# Patient Record
Sex: Male | Born: 1975 | Race: White | Hispanic: No | Marital: Single | Smoking: Current every day smoker
Health system: Southern US, Community
[De-identification: ages and names within clinical notes are randomized; demographics above are authoritative.]

## PROBLEM LIST (undated history)

## (undated) DIAGNOSIS — Z720 Tobacco use: Secondary | ICD-10-CM

## (undated) DIAGNOSIS — I456 Pre-excitation syndrome: Secondary | ICD-10-CM

## (undated) DIAGNOSIS — I471 Supraventricular tachycardia, unspecified: Secondary | ICD-10-CM

---

## 1999-10-14 ENCOUNTER — Encounter: Payer: Self-pay | Admitting: Emergency Medicine

## 1999-10-14 ENCOUNTER — Emergency Department (HOSPITAL_COMMUNITY): Admission: EM | Admit: 1999-10-14 | Discharge: 1999-10-14 | Payer: Self-pay | Admitting: Emergency Medicine

## 2002-07-25 ENCOUNTER — Encounter: Payer: Self-pay | Admitting: Emergency Medicine

## 2002-07-25 ENCOUNTER — Emergency Department (HOSPITAL_COMMUNITY): Admission: EM | Admit: 2002-07-25 | Discharge: 2002-07-25 | Payer: Self-pay | Admitting: Emergency Medicine

## 2003-05-09 ENCOUNTER — Emergency Department (HOSPITAL_COMMUNITY): Admission: EM | Admit: 2003-05-09 | Discharge: 2003-05-10 | Payer: Self-pay | Admitting: Emergency Medicine

## 2003-05-10 ENCOUNTER — Encounter: Payer: Self-pay | Admitting: Emergency Medicine

## 2011-10-04 ENCOUNTER — Other Ambulatory Visit: Payer: Self-pay

## 2011-10-04 ENCOUNTER — Encounter: Payer: Self-pay | Admitting: *Deleted

## 2011-10-04 ENCOUNTER — Encounter (HOSPITAL_COMMUNITY)
Admission: EM | Disposition: A | Discharge status: Home / Self Care | Payer: Self-pay | Source: Home / Self Care | Attending: Cardiology

## 2011-10-04 ENCOUNTER — Inpatient Hospital Stay (HOSPITAL_COMMUNITY)
Admission: EM | Admit: 2011-10-04 | Discharge: 2011-10-05 | DRG: 287 | Disposition: A | Discharge status: Home / Self Care | Payer: 59 | Attending: Cardiology | Admitting: Cardiology

## 2011-10-04 ENCOUNTER — Emergency Department (HOSPITAL_COMMUNITY): Payer: Self-pay

## 2011-10-04 DIAGNOSIS — R072 Precordial pain: Secondary | ICD-10-CM

## 2011-10-04 DIAGNOSIS — F17200 Nicotine dependence, unspecified, uncomplicated: Secondary | ICD-10-CM | POA: Diagnosis present

## 2011-10-04 DIAGNOSIS — I471 Supraventricular tachycardia, unspecified: Secondary | ICD-10-CM

## 2011-10-04 DIAGNOSIS — Z720 Tobacco use: Secondary | ICD-10-CM

## 2011-10-04 DIAGNOSIS — I2119 ST elevation (STEMI) myocardial infarction involving other coronary artery of inferior wall: Secondary | ICD-10-CM

## 2011-10-04 DIAGNOSIS — G609 Hereditary and idiopathic neuropathy, unspecified: Secondary | ICD-10-CM | POA: Diagnosis present

## 2011-10-04 DIAGNOSIS — I498 Other specified cardiac arrhythmias: Principal | ICD-10-CM | POA: Diagnosis present

## 2011-10-04 DIAGNOSIS — R0602 Shortness of breath: Secondary | ICD-10-CM | POA: Diagnosis present

## 2011-10-04 DIAGNOSIS — I456 Pre-excitation syndrome: Secondary | ICD-10-CM | POA: Insufficient documentation

## 2011-10-04 DIAGNOSIS — E876 Hypokalemia: Secondary | ICD-10-CM | POA: Diagnosis present

## 2011-10-04 HISTORY — DX: Pre-excitation syndrome: I45.6

## 2011-10-04 HISTORY — DX: Supraventricular tachycardia, unspecified: I47.10

## 2011-10-04 HISTORY — DX: Tobacco use: Z72.0

## 2011-10-04 HISTORY — PX: CARDIAC CATHETERIZATION: SHX172

## 2011-10-04 HISTORY — DX: Supraventricular tachycardia: I47.1

## 2011-10-04 HISTORY — PX: LEFT HEART CATHETERIZATION WITH CORONARY ANGIOGRAM: SHX5451

## 2011-10-04 LAB — CBC
MCH: 32.5 pg (ref 26.0–34.0)
MCV: 88.1 fL (ref 78.0–100.0)
MCV: 90.9 fL (ref 78.0–100.0)
Platelets: 270 10*3/uL (ref 150–400)
Platelets: 197 10*3/uL (ref 150–400)
RDW: 12 % (ref 11.5–15.5)
RDW: 12.2 % (ref 11.5–15.5)
WBC: 14.2 10*3/uL — ABNORMAL HIGH (ref 4.0–10.5)
WBC: 8.6 10*3/uL (ref 4.0–10.5)

## 2011-10-04 LAB — CARDIAC PANEL(CRET KIN+CKTOT+MB+TROPI)
CK, MB: 3.5 ng/mL (ref 0.3–4.0)
Relative Index: 1.5 (ref 0.0–2.5)
Total CK: 253 U/L — ABNORMAL HIGH (ref 7–232)
Troponin I: <0.3 ng/mL (ref <0.30)
Troponin I: <0.3 ng/mL (ref <0.30)

## 2011-10-04 LAB — RAPID URINE DRUG SCREEN, HOSP PERFORMED
Amphetamines: NOT DETECTED
Cocaine: NOT DETECTED
Opiates: NOT DETECTED

## 2011-10-04 LAB — COMPREHENSIVE METABOLIC PANEL
ALT: 11 U/L (ref 0–53)
AST: 18 U/L (ref 0–37)
CO2: 18 mEq/L — ABNORMAL LOW (ref 19–32)
Calcium: 9.7 mg/dL (ref 8.4–10.5)
Sodium: 139 mEq/L (ref 135–145)
Total Protein: 6.5 g/dL (ref 6.0–8.3)

## 2011-10-04 LAB — DIFFERENTIAL
Basophils Absolute: 0 10*3/uL (ref 0.0–0.1)
Eosinophils Absolute: 0.1 10*3/uL (ref 0.0–0.7)
Eosinophils Relative: 1 % (ref 0–5)
Lymphocytes Relative: 19 % (ref 12–46)

## 2011-10-04 LAB — CREATININE, SERUM
Creatinine, Ser: 0.92 mg/dL (ref 0.50–1.35)
GFR calc Af Amer: >90 mL/min (ref >90)

## 2011-10-04 LAB — POCT I-STAT, CHEM 8
BUN: 8 mg/dL (ref 6–23)
Calcium, Ion: 1.01 mmol/L — ABNORMAL LOW (ref 1.12–1.32)
Chloride: 105 mEq/L (ref 96–112)
Glucose, Bld: 101 mg/dL — ABNORMAL HIGH (ref 70–99)

## 2011-10-04 LAB — POCT I-STAT TROPONIN I

## 2011-10-04 SURGERY — LEFT HEART CATHETERIZATION WITH CORONARY ANGIOGRAM
Anesthesia: LOCAL

## 2011-10-04 MED ORDER — ONDANSETRON HCL 4 MG/2ML IJ SOLN: 4.0000 mg | Freq: Four times a day (QID) | INTRAMUSCULAR | Status: DC | PRN

## 2011-10-04 MED ORDER — NITROGLYCERIN 0.4 MG SL SUBL: 0.4000 mg | SUBLINGUAL_TABLET | SUBLINGUAL | Status: DC | PRN

## 2011-10-04 MED ORDER — HEPARIN (PORCINE) IN NACL 2-0.9 UNIT/ML-% IJ SOLN
INTRAMUSCULAR | Status: AC
  Filled 2011-10-04: qty 1000

## 2011-10-04 MED ORDER — SODIUM CHLORIDE 0.9 % IJ SOLN: 3.0000 mL | INTRAMUSCULAR | Status: DC | PRN

## 2011-10-04 MED ORDER — LORAZEPAM 2 MG/ML IJ SOLN
1.0000 mg | Freq: Once | INTRAMUSCULAR | Status: AC
  Administered 2011-10-04: 1 mg via INTRAVENOUS
  Filled 2011-10-04: qty 1

## 2011-10-04 MED ORDER — POTASSIUM CHLORIDE CRYS ER 20 MEQ PO TBCR
40.0000 meq | EXTENDED_RELEASE_TABLET | Freq: Once | ORAL | Status: AC
  Administered 2011-10-04: 40 meq via ORAL
  Filled 2011-10-04: qty 2

## 2011-10-04 MED ORDER — FENTANYL CITRATE 0.05 MG/ML IJ SOLN
INTRAMUSCULAR | Status: AC
  Filled 2011-10-04: qty 2

## 2011-10-04 MED ORDER — LIDOCAINE HCL (PF) 1 % IJ SOLN
INTRAMUSCULAR | Status: AC
  Filled 2011-10-04: qty 30

## 2011-10-04 MED ORDER — SODIUM CHLORIDE 0.9 % IV SOLN
INTRAVENOUS | Status: AC
  Administered 2011-10-04: 20:00:00 via INTRAVENOUS

## 2011-10-04 MED ORDER — ASPIRIN EC 81 MG PO TBEC
81.0000 mg | DELAYED_RELEASE_TABLET | Freq: Every day | ORAL | Status: DC
  Administered 2011-10-05: 81 mg via ORAL
  Filled 2011-10-04: qty 1

## 2011-10-04 MED ORDER — MIDAZOLAM HCL 2 MG/2ML IJ SOLN
INTRAMUSCULAR | Status: AC
  Filled 2011-10-04: qty 2

## 2011-10-04 MED ORDER — VERAPAMIL HCL 2.5 MG/ML IV SOLN
INTRAVENOUS | Status: AC
  Filled 2011-10-04: qty 2

## 2011-10-04 MED ORDER — METOPROLOL TARTRATE 12.5 MG HALF TABLET
12.5000 mg | ORAL_TABLET | Freq: Two times a day (BID) | ORAL | Status: DC
  Administered 2011-10-04 – 2011-10-05 (×2): 12.5 mg via ORAL
  Filled 2011-10-04 (×4): qty 1

## 2011-10-04 MED ORDER — ACETAMINOPHEN 325 MG PO TABS: 650.0000 mg | ORAL_TABLET | Freq: Four times a day (QID) | ORAL | Status: DC | PRN

## 2011-10-04 MED ORDER — NICOTINE 21 MG/24HR TD PT24
21.0000 mg | MEDICATED_PATCH | Freq: Every day | TRANSDERMAL | Status: DC
  Administered 2011-10-04: 21 mg via TRANSDERMAL
  Filled 2011-10-04 (×2): qty 1

## 2011-10-04 MED ORDER — ENOXAPARIN SODIUM 40 MG/0.4ML ~~LOC~~ SOLN
40.0000 mg | SUBCUTANEOUS | Status: DC
  Administered 2011-10-04: 40 mg via SUBCUTANEOUS
  Filled 2011-10-04 (×2): qty 0.4

## 2011-10-04 MED ORDER — SODIUM CHLORIDE 0.9 % IV SOLN: 250.0000 mL | INTRAVENOUS | Status: DC | PRN

## 2011-10-04 MED ORDER — SODIUM CHLORIDE 0.9 % IV SOLN
Freq: Once | INTRAVENOUS | Status: AC
  Administered 2011-10-04: 01:00:00 via INTRAVENOUS

## 2011-10-04 MED ORDER — SODIUM CHLORIDE 0.9 % IJ SOLN: 3.0000 mL | Freq: Two times a day (BID) | INTRAMUSCULAR | Status: DC

## 2011-10-04 MED ORDER — NITROGLYCERIN 0.2 MG/ML ON CALL CATH LAB
INTRAVENOUS | Status: AC
  Filled 2011-10-04: qty 1

## 2011-10-04 MED ORDER — POTASSIUM CHLORIDE CRYS ER 20 MEQ PO TBCR
40.0000 meq | EXTENDED_RELEASE_TABLET | Freq: Every day | ORAL | Status: DC
  Administered 2011-10-05: 40 meq via ORAL
  Filled 2011-10-04: qty 2

## 2011-10-04 MED ORDER — HEPARIN SODIUM (PORCINE) 1000 UNIT/ML IJ SOLN
INTRAMUSCULAR | Status: AC
  Filled 2011-10-04: qty 1

## 2011-10-04 MED ORDER — ACETAMINOPHEN 325 MG PO TABS
650.0000 mg | ORAL_TABLET | ORAL | Status: DC | PRN
  Administered 2011-10-04: 650 mg via ORAL
  Filled 2011-10-04: qty 2

## 2011-10-04 MED ORDER — SODIUM CHLORIDE 0.9 % IV SOLN: INTRAVENOUS | Status: DC

## 2011-10-04 NOTE — ED Notes (Signed)
Cardiology at bedside consulting with pt.

## 2011-10-04 NOTE — Progress Notes (Signed)
  Echocardiogram 2D Echocardiogram has been performed.  Juanita Laster Marche Hottenstein, RDCS 10/04/2011, 12:23 PM

## 2011-10-04 NOTE — ED Notes (Signed)
Cards at bedside

## 2011-10-04 NOTE — ED Notes (Signed)
Pt called EMS for R shoulder pain and hand numbness.  When EMS arrived, pt has rr 30, R hands were numb and curled inward, and in svt (220).  EMS did vagal maneuvers, bringing hr down to 128.  SL to LAC 18G.

## 2011-10-04 NOTE — ED Notes (Signed)
Report received, assumed care.  

## 2011-10-04 NOTE — ED Notes (Signed)
portible echo at bedside

## 2011-10-04 NOTE — Procedures (Signed)
Cardiac Catheterization Procedure Note  Name: Albert Robles MRN: [id number] DOB: 1976/10/27  Procedure: Left Heart Cath, Selective Coronary Angiography  Indication: Inferior ST elevation, chest pain.    Procedural Details: The right wrist was prepped, draped, and anesthetized with 1% lidocaine. Using the modified Seldinger technique, a 5 French sheath was introduced into the right radial artery. 3 mg of verapamil was administered through the sheath, weight-based unfractionated heparin was administered intravenously. Standard Judkins catheters were used for selective coronary angiography. Catheter exchanges were performed over an exchange length guidewire. There were no immediate procedural complications. A TR band was used for radial hemostasis at the completion of the procedure.  The patient was transferred to the post catheterization recovery area for further monitoring.  Procedural Findings:  Coronary angiography: Coronary dominance: right  Left mainstem: No angiographic disease.  Left anterior descending (LAD): No angiographic disease.  Left circumflex (LCx): No angiographic disease.  Right coronary artery (RCA): Dominant vessel, no angiographic disease.  Final Conclusions:  No angiographic coronary disease.   Recommendations: Discuss with EP regarding WPW-related SVT.   Marca Ancona 10/04/2011, 3:21 PM

## 2011-10-04 NOTE — ED Provider Notes (Signed)
History     CSN: [id number] Arrival date & time: 10/04/2011 12:29 AM   First MD Initiated Contact with Patient 10/04/11 0032      Chief Complaint  Patient presents with  . Numbness    to R hand  . Tachycardia    (Consider location/radiation/quality/duration/timing/severity/associated sxs/prior treatment) HPI Comments: 35 year old male with no significant past medical problems who presents by ambulance after acute onset of right shoulder pain, right neck pain and mid chest pain. 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 He denies any other changes in his routine including substance abuse, alcohol use, stimulant use, over-the-counter medications, fevers chills nausea vomiting back pain belly pain swelling rashes weakness or change in vision.  He does admit to having tingling and numbness of his fingers bilaterally and the EMS noted that his fingers were "drawn up" on their arrival. He states he has never had this sensation before. EMS rhythm strips show SVT at pulse around 200. Post conversion strips show sinus tachycardia.  The history is provided by the patient and the EMS personnel.    History reviewed. No pertinent past medical history.  History reviewed. No pertinent past surgical history.  History reviewed. No pertinent family history.  History  Substance Use Topics  . Smoking status: Not on file  . Smokeless tobacco: Not on file  . Alcohol Use: Not on file      Review of Systems  All other systems reviewed and are negative.    Allergies  Review of patient's allergies indicates no known allergies.  Home Medications   Current Outpatient Rx  Name Route Sig Dispense Refill  . ACETAMINOPHEN 500 MG PO TABS Oral Take 500-1,000 mg by mouth every 6 (six) hours as needed. For pain       BP 104/57  Pulse 83  Temp(Src) 98.8 F (37.1 C) (Oral)   Resp 18  SpO2 100%  Physical Exam  Nursing note and vitals reviewed. Constitutional: He appears well-developed and well-nourished.       Anxious and tachypneic  HENT:  Head: Normocephalic and atraumatic.  Mouth/Throat: Oropharynx is clear and moist. No oropharyngeal exudate.  Eyes: Conjunctivae and EOM are normal. Pupils are equal, round, and reactive to light. Right eye exhibits no discharge. Left eye exhibits no discharge. No scleral icterus.  Neck: Normal range of motion. Neck supple. No JVD present. No thyromegaly present.  Cardiovascular: Regular rhythm, normal heart sounds and intact distal pulses.  Exam reveals no gallop and no friction rub.   No murmur heard.      Tachycardia  Pulmonary/Chest: Effort normal and breath sounds normal. No respiratory distress. He has no wheezes. He has no rales.  Abdominal: Soft. Bowel sounds are normal. He exhibits no distension and no mass. There is no tenderness.  Musculoskeletal: Normal range of motion. He exhibits no edema and no tenderness.  Lymphadenopathy:    He has no cervical adenopathy.  Neurological: He is alert. Coordination normal.  Skin: Skin is warm and dry. No rash noted. No erythema.  Psychiatric:       Anxious appearing    ED Course  Procedures (including critical care time)  Labs Reviewed  CBC - Abnormal; Notable for the following:    WBC 14.2 (*)    MCHC 37.1 (*) RULED OUT INTERFERING SUBSTANCES   All other components within normal limits  DIFFERENTIAL - Abnormal; Notable  for the following:    Neutro Abs 10.4 (*)    All other components within normal limits  COMPREHENSIVE METABOLIC PANEL - Abnormal; Notable for the following:    Potassium 3.1 (*)    CO2 18 (*)    Glucose, Bld 104 (*)    GFR calc non Af Amer 87 (*)    All other components within normal limits  POCT I-STAT, CHEM 8 - Abnormal; Notable for the following:    Potassium 3.0 (*)    Glucose, Bld 101 (*)    Calcium, Ion 1.01 (*)    All other components  within normal limits  CARDIAC PANEL(CRET KIN+CKTOT+MB+TROPI) - Abnormal; Notable for the following:    Total CK 253 (*)    All other components within normal limits  POCT I-STAT TROPONIN I  POCT I-STAT TROPONIN I  I-STAT, CHEM 8  I-STAT TROPONIN I  I-STAT TROPONIN I   Dg Chest Port 1 View  10/04/2011  *RADIOLOGY REPORT*  Clinical Data: 35 year old with shortness of breath.  PORTABLE CHEST - 1 VIEW  Comparison: None.  Findings: 0057 hours.  Heart size and mediastinal contours are normal.  The lungs appear clear.  Vague density projecting inferior to the left second rib is attributed to adjacent EKG lead.  There is no pleural effusion.  Osseous structures appear normal.  IMPRESSION: No acute cardiopulmonary process.  Original Report Authenticated By: Gerrianne Scale, M.D.     1. Wolf-Parkinson-White syndrome   2. SVT (supraventricular tachycardia)       MDM  Anxious-appearing young man with tachycardia, hyperventilation, normal oxygen saturation and blood pressure and temperature. ST-T according to the rhythm strips produced by EMS shows successful conversion of SVT into sinus tachycardia, we'll draw labs, chest x-ray, oxygen, reevaluate.   ED ECG REPORT   Date: 10/04/2011   Rate: 122  Rhythm: sinus tachycardia  QRS Axis: normal  Intervals: normal  ST/T Wave abnormalities: normal  Conduction Disutrbances: Prolonged QRS with positive delta wave consistent with Wolff-Parkinson-White syndrome  Narrative Interpretation: Abnormal EKG, WPW  Old EKG Reviewed: none available  ED ECG REPORT   Date: 10/04/2011   Rate: 100  Rhythm: normal sinus rhythm  QRS Axis: normal  Intervals: QRS prolonged with delta wave  ST/T Wave abnormalities: nonspecific ST/T changes  Conduction Disutrbances:nonspecific intraventricular conduction delay  Narrative Interpretation: Slight abnormalities of the ST segments of the inferior leads and T waves of the anterior precordial leads V1 V2. Incomplete  right bundle branch block likely  Old EKG Reviewed: changes noted  Care discussed with cardiologist on call Dr. Antoine Poche who agrees with emergency department rule out with cardiac enzymes, electrophysiology followup. Patient reevaluated and currently has no chest pain or shortness of breath and states that he has never had any palpitations in the past  Discussed care with cardiology on-call after patient has had repeated EKGs showing dynamic changes including T wave inversion in precordial leads. Cardiology will see the patient in the emergency department this morning.  ED ECG REPORT   Date: 10/04/2011   Rate: 92  Rhythm: normal sinus rhythm  QRS Axis: normal  Intervals: normal  ST/T Wave abnormalities: T-wave inversions V1 V2 V3  Conduction Disutrbances:QRS widening and morphology consistent with incomplete right bundle branch block development  Narrative Interpretation: Delta wave persists consistent with WPW  Old EKG Reviewed: changes noted   Vida Roller, MD 10/04/11 [phone]

## 2011-10-04 NOTE — ED Notes (Signed)
MD at bedside. Dr.Miller talking to patient about diagnosis

## 2011-10-04 NOTE — ED Notes (Signed)
Albert Robles, ANP with [facility] at bedside.

## 2011-10-04 NOTE — ED Notes (Signed)
Patient denies pain and is resting comfortably. Denies CP, SOB or shoulder pain presently. VSS. NAD

## 2011-10-04 NOTE — ED Notes (Signed)
This note also relates to the following rows which could not be included: Pulse Rate - Cannot attach notes to rows marked as read only Resp - Cannot attach notes to rows marked as read only SpO2 - Cannot attach notes to rows marked as read only 

## 2011-10-04 NOTE — ED Notes (Signed)
Report received from Monica, RN.

## 2011-10-04 NOTE — H&P (Addendum)
Patient ID: Nhan Qualley MRN: [id number], DOB/AGE: 03/16/1976   Admit date: 10/04/2011   Primary Physician: No primary provider on file. Primary Cardiologist: New to [facility]  Pt. Profile:   35 y/o male w/o prior cardiac history who presented to the ED last night after an episode of c/p and tachypalps - found to have WPW.  Problem List: Past Medical History  Diagnosis Date  . Wolf-Parkinson-White syndrome     Diagnosed 10/04/2011  . Tobacco abuse     20 pack yr history - currently smoking 1ppd    History reviewed. No pertinent past surgical history.   Allergies: No Known Allergies  HPI:   35 y/o male w/o prior cardiac history.  Over the past month he has noted occasional r shoulder/subscapular discomfort that would come on and relieve spontaneously after a few minutes.  It has not limited his usual activities.  Last pm, around 10:30p, he was driving home from work and had sudden onset of r shoulder/subscapular pain radiating up to his right jaw/neck, assoc w/ sscp/tightness, dyspnea, and tachypalpitations.  Once he was home, he took a shower, which worsened Ss, then called EMS.  Upon EMS arrival, he was noted to be in SVT.  With the use of vagal maneuvers, pt apparently converted to sinus rhythm en route to ED.  Upon arrival here, he cont to c/o discomfort as outlined above.  Initial ECG showed sinus tach and it was noted that pt had distinct delta waves consistent w/ WPW.  ECG was reviewed by Cardiology overnight w/ recommendation for cardiac markers and prob d/c from ED with outpt cards/EP f/u.  Enzymes have been negative, though pt's chest and shoulder pain persisted until about 3-4am.  Pt is currently symptom free however his ECG now shows slight inflat ST elevation and deep anterior TWI.   Home Medications Medications Prior to Admission  Medication Dose Route Frequency Provider Last Rate Last Dose  . 0.9 %  sodium chloride infusion   Intravenous Once Vida Roller, MD  75 mL/hr at 10/04/11 0103    . LORazepam (ATIVAN) injection 1 mg  1 mg Intravenous Once Vida Roller, MD   1 mg at 10/04/11 0106   No current outpatient prescriptions on file as of 10/04/2011.     History reviewed. No pertinent family history.   History   Social History  . Marital Status: Single    Spouse Name: N/A    Number of Children: N/A  . 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 Smoking status: Current Everyday Smoker -- 1.0 packs/day for 20 years    Types: Cigarettes  . Smokeless tobacco: Never Used  . Alcohol Use: No  . Drug Use: No  . Sexually Active: Yes   Other Topics Concern  . Not on file   Social History Narrative   Lives with long-term girlfriend and young child     Review of Systems: General: negative for chills, fever, night sweats or weight changes.  Cardiovascular:+++ c/p, r shoulder pain, r scapular pain,  sob, palpitations as outlined in the HPI.  Dermatological: negative for rash Respiratory: negative for cough or wheezing Urologic: negative for hematuria Abdominal: negative for nausea, vomiting, diarrhea, bright red blood per rectum, melena, or hematemesis Neurologic: negative for visual changes, syncope, or dizziness All other systems reviewed and are otherwise negative except as noted above.  Physical Exam: Blood pressure 103/59, pulse 88,  temperature 98.8 F (37.1 C), temperature source Oral, resp. rate 19, SpO2 100.00%.  General: Well developed, well nourished, in no acute distress. Head: Normocephalic, atraumatic, sclera non-icteric, no xanthomas, nares are without discharge.  Neck: Supple without bruits or JVD. Lungs:  Resp regular and unlabored, scatt rhonchi. Heart: RRR no s3, s4, or murmurs. Abdomen: Soft, non-tender, non-distended, BS + x 4.  Msk:  Strength and tone appears normal for age. Extremities: No clubbing, cyanosis or edema. DP/PT/Radials 2+  and equal bilaterally. Neuro: Alert and oriented X 3. Moves all extremities spontaneously. Psych: Normal affect.   Labs:   Results for orders placed during the hospital encounter of 10/04/11 (from the past 72 hour(s))  CBC     Status: Abnormal   Collection Time   10/04/11 12:40 AM      Component Value Range Comment   WBC 14.2 (*) 4.0 - 10.5 (K/uL)    RBC 4.78  4.22 - 5.81 (MIL/uL)    Hemoglobin 15.6  13.0 - 17.0 (g/dL)    HCT 16.1  09.6 - 04.5 (%)    MCV 88.1  78.0 - 100.0 (fL)    MCH 32.6  26.0 - 34.0 (pg)    MCHC 37.1 (*) 30.0 - 36.0 (g/dL) RULED OUT INTERFERING SUBSTANCES   RDW 12.0  11.5 - 15.5 (%)    Platelets 270  150 - 400 (K/uL)   DIFFERENTIAL     Status: Abnormal   Collection Time   10/04/11 12:40 AM      Component Value Range Comment   Neutrophils Relative 73  43 - 77 (%)    Neutro Abs 10.4 (*) 1.7 - 7.7 (K/uL)    Lymphocytes Relative 19  12 - 46 (%)    Lymphs Abs 2.7  0.7 - 4.0 (K/uL)    Monocytes Relative 6  3 - 12 (%)    Monocytes Absolute 0.9  0.1 - 1.0 (K/uL)    Eosinophils Relative 1  0 - 5 (%)    Eosinophils Absolute 0.1  0.0 - 0.7 (K/uL)    Basophils Relative 0  0 - 1 (%)    Basophils Absolute 0.0  0.0 - 0.1 (K/uL)   COMPREHENSIVE METABOLIC PANEL     Status: Abnormal   Collection Time   10/04/11 12:40 AM      Component Value Range Comment   Sodium 139  135 - 145 (mEq/L)    Potassium 3.1 (*) 3.5 - 5.1 (mEq/L)    Chloride 104  96 - 112 (mEq/L)    CO2 18 (*) 19 - 32 (mEq/L)    Glucose, Bld 104 (*) 70 - 99 (mg/dL)    BUN 10  6 - 23 (mg/dL)    Creatinine, Ser 4.09  0.50 - 1.35 (mg/dL)    Calcium 9.7  8.4 - 10.5 (mg/dL)    Total Protein 6.5  6.0 - 8.3 (g/dL)    Albumin 4.1  3.5 - 5.2 (g/dL)    AST 18  0 - 37 (U/L)    ALT 11  0 - 53 (U/L)    Alkaline Phosphatase 44  39 - 117 (U/L)    Total Bilirubin 0.5  0.3 - 1.2 (mg/dL)    GFR calc non Af Amer 87 (*) >90 (mL/min)    GFR calc Af Amer >90  >90 (mL/min)   POCT I-STAT, CHEM 8     Status: Abnormal    Collection Time   10/04/11 12:59 AM      Component Value Range Comment  Sodium 142  135 - 145 (mEq/L)    Potassium 3.0 (*) 3.5 - 5.1 (mEq/L)    Chloride 105  96 - 112 (mEq/L)    BUN 8  6 - 23 (mg/dL)    Creatinine, Ser 2.13  0.50 - 1.35 (mg/dL)    Glucose, Bld 086 (*) 70 - 99 (mg/dL)    Calcium, Ion 5.78 (*) 1.12 - 1.32 (mmol/L)    TCO2 18  0 - 100 (mmol/L)    Hemoglobin 14.6  13.0 - 17.0 (g/dL)    HCT 46.9  62.9 - 52.8 (%)   POCT I-STAT TROPONIN I     Status: Normal   Collection Time   10/04/11  2:01 AM      Component Value Range Comment   Troponin i, poc 0.00  0.00 - 0.08 (ng/mL)    Comment 3            POCT I-STAT TROPONIN I     Status: Normal   Collection Time   10/04/11  5:32 AM      Component Value Range Comment   Troponin i, poc 0.02  0.00 - 0.08 (ng/mL)    Comment 3            CARDIAC PANEL(CRET KIN+CKTOT+MB+TROPI)     Status: Abnormal   Collection Time   10/04/11  6:10 AM      Component Value Range Comment   Total CK 253 (*) 7 - 232 (U/L)    CK, MB 3.7  0.3 - 4.0 (ng/mL)    Troponin I <0.30  <0.30 (ng/mL)    Relative Index 1.5  0.0 - 2.5       Radiology/Studies: Dg Chest Port 1 View  10/04/2011  *RADIOLOGY REPORT*  Clinical Data: 35 year old with shortness of breath.  PORTABLE CHEST - 1 VIEW  Comparison: None.  Findings: 0057 hours.  Heart size and mediastinal contours are normal.  The lungs appear clear.  Vague density projecting inferior to the left second rib is attributed to adjacent EKG lead.  There is no pleural effusion.  Osseous structures appear normal.  IMPRESSION: No acute cardiopulmonary process.  Original Report Authenticated By: Gerrianne Scale, M.D.    EKG:  RSR, 92, delta waves.  <40mm ST elevation - II, III, aVF, V5, V5; TWI V1-V3  ASSESSMENT AND PLAN:   1.  SVT/WPW:  Pt with an episode of SVT lasting approx by pt report - broke with vagal maneuvers by EMS.  ECG here shows delta waves consistent w/ WPW.  His EKG this am shows TWI in anterior  leads with inferlat st elevation.  Chest pain has resolved.  We will plan to observe him today.  Add low dose BB (BP is soft) and check echo.  We'll ask EP to see for consideration of eventual RFCA.  2.  Chest Pain/Abnl EKG:  See above. Enzymes have been negative.  Cont to cycle.  Add asa, bb.  3.  Tobacco Abuse:  Cessation advised.  Will order formal counseling.  4.  Hypokalemia:  Supp and check Mg.   Signed, [name], NP 10/04/2011, 8:45 AM   Patient seen with NP, agree with note.  Patient does not remember ever having an ECG in the past (even though he was in the [employer] for 4 years).  Regular SVT broke while on ambulance and has not recurred, telemetry strips from EMS reviewed.  He has had no prior episodes of tachypalpitations similar to last night.  He had prolonged  chest pain for about 2 hours even after resolution of SVT.  He has findings on ECG suggestive of WPW (delta wave).  Interestingly, he has also labile anterior T waves with development of anterior T wave inversion over the morning.  He has about 1 mm inferior ST elevation.  Cardiac enzymes so far negative.  Suspect his symptoms are all due to SVT related to WPW but will keep patient overnight for observation.  1. Chest pain, abnormal ECG: All changes cannot be explained by WPW.  ? Myopericarditis-type picture.  Doubt coronary disease but ECG is certainly worrisome.  No family history of premature CAD. - Serial ECGs - Echocardiogram: ? Myopericarditis.  - cycle cardiac enzymes - Start low dose beta blocker. - Would consider coronary CT angiogram tomorrow given very abnormal ECG but will reassess in the morning as long as enzymes remain negative.  2. SVT related to WPW.  Starting beta blocker.  - Will need EP evaluation for possible WPW ablation (will discuss with Dr. Graciela Husbands).   Marca Ancona 10/04/2011 9:31 AM  I discussed ECGs with Dr. Graciela Husbands.  We are both concerned about the inferior ST elevation, which does seem  to have worsened.  The labile anterior T waves also are not explained by post-WPW SVT.  Cardiac enzymes so far negative, chest pain resolved after about 2 hours last night.  Myopericarditis is certainly possible, will get echo.  However, we are concerned that there could be coronary thrombus or spasm given ECG findings.  We think that the best course will be definitive test, which would be catheterization today, rather than CT angiogram.    Marca Ancona 10/04/2011 11:36 AM

## 2011-10-05 ENCOUNTER — Other Ambulatory Visit: Payer: Self-pay

## 2011-10-05 ENCOUNTER — Encounter (HOSPITAL_COMMUNITY): Payer: Self-pay | Admitting: Internal Medicine

## 2011-10-05 DIAGNOSIS — I471 Supraventricular tachycardia: Secondary | ICD-10-CM

## 2011-10-05 LAB — LIPID PANEL
Total CHOL/HDL Ratio: 3.9 RATIO
VLDL: 24 mg/dL (ref 0–40)

## 2011-10-05 LAB — BASIC METABOLIC PANEL
BUN: 12 mg/dL (ref 6–23)
Creatinine, Ser: 1.08 mg/dL (ref 0.50–1.35)
GFR calc non Af Amer: 88 mL/min — ABNORMAL LOW (ref >90)
Glucose, Bld: 89 mg/dL (ref 70–99)
Potassium: 3.9 mEq/L (ref 3.5–5.1)

## 2011-10-05 LAB — TSH: TSH: 1.687 u[IU]/mL (ref 0.350–4.500)

## 2011-10-05 LAB — CBC
Hemoglobin: 12.9 g/dL — ABNORMAL LOW (ref 13.0–17.0)
MCH: 32.5 pg (ref 26.0–34.0)
MCHC: 35.2 g/dL (ref 30.0–36.0)
RDW: 12.2 % (ref 11.5–15.5)

## 2011-10-05 LAB — MAGNESIUM: Magnesium: 2 mg/dL (ref 1.5–2.5)

## 2011-10-05 MED ORDER — METOPROLOL TARTRATE 50 MG PO TABS: 50.0000 mg | ORAL_TABLET | ORAL | Status: DC | PRN

## 2011-10-05 NOTE — Consult Note (Signed)
Reason for Consult: symptomatic SVT with WPW syndrome   Referring Physician: Shirlee Latch  HPI: Mr. Garriga is a very pleasant 35 year old man who has never had symptoms of chest pain or palpitations. He is a former Arts development officer. The patient was in his usual state of health until the day of admission when he noted shoulder pain associated with shortness of breath. There is mild right-sided chest discomfort. He had no palpitations. He denies syncope or history of syncope. The patient presented to the emergency room where he was found to have SVT at 200 beats per minute. He performed vagal maneuvers and his symptoms resolved. He was admitted to the hospital with persistent ST elevation and underwent catheterization which demonstrated normal coronary arteries. He is referred now for additional evaluation. He denies any symptoms of SVT in the past. He denies any history of right-sided shoulder pain in the past. It is believed that these are his only symptoms associated with SVT besides shortness of breath.  PMH: Past Medical History  Diagnosis Date  . Wolf-Parkinson-White syndrome     Diagnosed 10/04/2011  . Tobacco abuse     20 pack yr history - currently smoking 1ppd  . Chest pain     in setting of SVT  . Angina 10/03/11    "first time ever"  . Shortness of breath 10/03/11    "related to SVT"  . Peripheral neuropathy 10/03/11    "related to SVT"    PSHX: Past Surgical History  Procedure Date  . Cardiac catheterization 10/04/11    FAMHX:History reviewed. No pertinent family history.  Social History:  reports that he has been smoking Cigarettes.  He has a 20 pack-year smoking history. He has never used smokeless tobacco. He reports that he does not drink alcohol or use illicit drugs.  Allergies: No Known Allergies  Dg Chest Port 1 View  10/04/2011  *RADIOLOGY REPORT*  Clinical Data: 35 year old with shortness of breath.  PORTABLE CHEST - 1 VIEW  Comparison: None.  Findings: 0057 hours.  Heart size  and mediastinal contours are normal.  The lungs appear clear.  Vague density projecting inferior to the left second rib is attributed to adjacent EKG lead.  There is no pleural effusion.  Osseous structures appear normal.  IMPRESSION: No acute cardiopulmonary process.  Original Report Authenticated By: Gerrianne Scale, M.D.    ROS  As stated in the HPI and negative for all other systems.  Physical Exam  Vitals:Blood pressure 126/74, pulse 72, temperature 98.6 F (37 C), temperature source Oral, resp. rate 20, SpO2 100.00%.  Well appearing young man, NAD HEENT: Unremarkable Neck:  No JVD, no thyromegally Lymphatics:  No adenopathy Back:  No CVA tenderness Lungs:  Clear with no wheezes, rales, or rhonchi. HEART:  Regular rate rhythm, no murmurs, no rubs, no clicks Abd:  Flat, positive bowel sounds, no organomegally, no rebound, no guarding Ext:  2 plus pulses, no edema, no cyanosis, no clubbing Skin:  No rashes no nodules.  Neuro:  CN II through XII intact, motor grossly intact  ECG - normal sinus rhythm with ventricular preexcitation consistent with a left lateral accessory pathway.  Assessment/Plan: 1. SVT - this is the initial presentation of the patient's SVT. I discussed the physiology of his SVT and the treatment options. He has been on no medical therapy at this point and because of this being his only episode, I have recommended that he take as needed beta blockers should his symptoms return. Catheter ablation was also discussed as  a potential curative treatment. However he has not undergone a trial of medical therapy. We'll plan to let the patient go home today with as needed beta blockers. I plan to see the patient back in the office in 4-6 weeks to see how he is doing. If he has a recurrence then consideration of catheter ablation would be recommended. Sharlot Gowda TaylorMD 10/05/2011, 8:02 AM

## 2011-10-05 NOTE — Discharge Summary (Signed)
Patient ID: Albert Robles,  MRN: [id number], DOB/AGE: [birth year] 35 y.o.  Admit date: 10/04/2011 Discharge date: 10/05/2011  Primary Care Provider: None Primary Cardiologist: Lewayne Bunting  Discharge Diagnoses Principal Problem:  *SVT (supraventricular tachycardia) Active Problems:  Chest pain  Wolf-Parkinson-White syndrome  Hypokalemia  Tobacco abuse   Allergies No Known Allergies  Procedures  10/04/2011 2D Echo Left ventricle: The cavity size was normal. Wall thickness was normal. Systolic function was normal. The estimated ejection fraction was in the range of 60% to 65%.  10/04/2011 Cardiac Catheterization Left mainstem: No angiographic disease.  Left anterior descending (LAD): No angiographic disease.  Left circumflex (LCx): No angiographic disease.  Right coronary artery (RCA): Dominant vessel, no angiographic disease.  Final Conclusions: No angiographic coronary disease.   History of Present Illness  35 y/o male w/o prior cardiac history. Over the past month he has noted occasional r shoulder/subscapular discomfort that would come on and relieve spontaneously after a few minutes. It has not limited his usual activities.  On the evening prior to admission, around 10:30p, he was driving home from work and had sudden onset of r shoulder/subscapular pain radiating up to his right jaw/neck, assoc w/ sscp/tightness, dyspnea, and tachypalpitations. Once he was home, he took a shower, which worsened Ss, then called EMS. Upon EMS arrival, he was noted to be in SVT. With the use of vagal maneuvers, pt apparently converted to sinus rhythm en route to ED. Upon arrival to the ED, he cont to c/o discomfort as outlined above. Initial ECG showed sinus tach and it was noted that pt had distinct delta waves consistent w/ WPW.  Follow-up EKG's showed progressive inferolateral st elevation with new anterior t-wave inversion.  Cardiac enzymes were negative and patient was symptom free.  Cardiology  was called to admit.  Hospital Course   Patient had no further SVT or chest pain  A 2D echo was performed and showed normal LV function.  Because of pts ST elevation and new anterior T wave inversion, none of which could be explained by the pts WPW/SVT, we opted to perform diagnostic cardiac catheterization.  This was carried out on 12/6 showing normal coronary arteries.  He has been seen by Electrophysiology with a recommendation to discharge pt today on prn beta blocker therapy with early f/u and consideration of radiofrequency catheter ablation in the future.   Discharge Vitals:  Blood pressure 126/74, pulse 72, temperature 98.6 F (37 C), temperature source Oral, resp. rate 20, SpO2 100.00%.    Labs: CBC:  Basename 10/05/11 0520 10/04/11 1811 10/04/11 0040  WBC 6.4 8.6 --  NEUTROABS -- -- 10.4*  HGB 12.9* 13.6 --  HCT 36.6* 38.1* --  MCV 92.2 90.9 --  PLT 173 197 --   Basic Metabolic Panel:  Basename 10/05/11 0520 10/04/11 2338 10/04/11 1811 10/04/11 0059 10/04/11 0040  NA 140 -- -- 142 --  K 3.9 -- -- 3.0* --  CL 108 -- -- 105 --  CO2 25 -- -- -- 18*  GLUCOSE 89 -- -- 101* --  BUN 12 -- -- 8 --  CREATININE 1.08 -- 0.92 -- --  CALCIUM 9.2 -- -- -- 9.7  MG -- 2.0 -- -- --  PHOS -- -- -- -- --   Liver Function Tests:  Basename 10/04/11 0040  AST 18  ALT 11  ALKPHOS 44  BILITOT 0.5  PROT 6.5  ALBUMIN 4.1   Cardiac Enzymes:  Basename 10/04/11 2338 10/04/11 1804 10/04/11 0610  CKTOTAL 351* 354* 253*  CKMB 3.4 3.5 3.7  CKMBINDEX -- -- --  TROPONINI <0.30 <0.30 <0.30   Fasting Lipid Panel:  Basename 10/05/11 0520  CHOL 133  HDL 34*  LDLCALC 75  TRIG 161  CHOLHDL 3.9  LDLDIRECT --   Thyroid Function Tests:  Basename 10/04/11 2338  TSH 1.687  T4TOTAL --  T3FREE --  THYROIDAB --    Disposition:   D/C home today in good condition.   Discharge Medications: Current Discharge Medication List    START taking these medications   Details    metoprolol tartrate (LOPRESSOR) 50 MG tablet Take 1 tablet (50 mg total) by mouth as needed (For recurrent palpitations). Qty: 30 tablet, Refills: 6      CONTINUE these medications which have NOT CHANGED   Details  acetaminophen (TYLENOL) 500 MG tablet Take 500-1,000 mg by mouth every 6 (six) hours as needed. For pain         Outstanding Labs/Studies  None  Duration of Discharge Encounter: Greater than 30 minutes including physician time.  Signed, [name] NP 10/05/2011, 10:15 AM

## 2011-10-12 ENCOUNTER — Telehealth: Payer: Self-pay | Admitting: Internal Medicine

## 2011-10-12 NOTE — Telephone Encounter (Signed)
Spoke with patient and he has had 2 episodes since being discharged from the hospital  He says he gets extreme dizziness  I have instructed him to take 1/2 the dose of Metoprolol if he has another episode and see if the 25mg  helps.  This may help with the dizziness and also he may be so sensitive to medications that he only needs 25mg .  If he still feels his heart racing an hour after taking the first 25mg  dose then he can take another  Patient verbalized understanding and will try this

## 2011-10-12 NOTE — Telephone Encounter (Signed)
New Msg: Pt calling stating that metroprol tartrate is not suiting well with him. Pt c/o feeling lightheaded and decreased BP shortly after taking medication. Pt wants to know if there is a substitute medication without the negative side effects. Please return pt call to discuss further.

## 2011-10-30 ENCOUNTER — Other Ambulatory Visit: Payer: Self-pay

## 2011-10-30 ENCOUNTER — Emergency Department (HOSPITAL_COMMUNITY)
Admission: EM | Admit: 2011-10-30 | Discharge: 2011-10-30 | Disposition: A | Discharge status: Home / Self Care | Payer: Self-pay | Attending: Emergency Medicine | Admitting: Emergency Medicine

## 2011-10-30 ENCOUNTER — Encounter (HOSPITAL_COMMUNITY): Payer: Self-pay | Admitting: *Deleted

## 2011-10-30 DIAGNOSIS — R064 Hyperventilation: Secondary | ICD-10-CM | POA: Insufficient documentation

## 2011-10-30 DIAGNOSIS — R002 Palpitations: Secondary | ICD-10-CM | POA: Insufficient documentation

## 2011-10-30 DIAGNOSIS — I456 Pre-excitation syndrome: Secondary | ICD-10-CM | POA: Insufficient documentation

## 2011-10-30 DIAGNOSIS — R0789 Other chest pain: Secondary | ICD-10-CM | POA: Insufficient documentation

## 2011-10-30 DIAGNOSIS — I471 Supraventricular tachycardia, unspecified: Secondary | ICD-10-CM

## 2011-10-30 DIAGNOSIS — Z720 Tobacco use: Secondary | ICD-10-CM | POA: Insufficient documentation

## 2011-10-30 DIAGNOSIS — R079 Chest pain, unspecified: Secondary | ICD-10-CM | POA: Insufficient documentation

## 2011-10-30 DIAGNOSIS — Z79899 Other long term (current) drug therapy: Secondary | ICD-10-CM | POA: Insufficient documentation

## 2011-10-30 LAB — CBC
MCH: 32.6 pg (ref 26.0–34.0)
MCHC: 36.2 g/dL — ABNORMAL HIGH (ref 30.0–36.0)
MCV: 90.3 fL (ref 78.0–100.0)
Platelets: 201 10*3/uL (ref 150–400)

## 2011-10-30 LAB — BASIC METABOLIC PANEL
Creatinine, Ser: 1.05 mg/dL (ref 0.50–1.35)
GFR calc Af Amer: >90 mL/min (ref >90)
Sodium: 143 mEq/L (ref 135–145)

## 2011-10-30 MED ORDER — METOPROLOL TARTRATE 50 MG PO TABS: 25.0000 mg | ORAL_TABLET | Freq: Two times a day (BID) | ORAL | Status: DC

## 2011-10-30 NOTE — ED Notes (Signed)
States rt sided chest pressure and ache that radiates into neck. States sx started around noon. Pt denies sob but presents tac hypnic. Pt hx of w-p-w diagnosed in November. States sx today feel the same as then. Pt a&ox3, will cont to monitor.

## 2011-10-30 NOTE — ED Notes (Signed)
Cardiology finished with consult. Pt does not want to be admitted. Pt states feeling better.

## 2011-10-30 NOTE — ED Provider Notes (Addendum)
History     CSN: [id number]  Arrival date & time 10/30/11  1504   First MD Initiated Contact with Patient 10/30/11 1531      Chief Complaint  Patient presents with  . Chest Pain  . Hyperventilating    (Consider location/radiation/quality/duration/timing/severity/associated sxs/prior treatment) Patient is a 36 y.o. male presenting with chest pain and palpitations.  Chest Pain Primary symptoms include palpitations.    Palpitations  This is a recurrent problem. The current episode started 3 to 5 hours ago (The patient is a 36 year old who cannot palpitation around noon while at work. He tried Valsalva maneuver without relief. He did not take any medications. He has had prior workup for this condition in December of 2012, and was diagnosed with supraventricul). The problem occurs constantly. The problem has been resolved. Episode Length: This episode lasted a couple of hours. Associated symptoms include chest pain. Treatments tried: Valsalva maneuver. The treatment provided no relief. Risk factors: Patient has known WPW syndrome.    Past Medical History  Diagnosis Date  . Wolf-Parkinson-White syndrome     Diagnosed 10/04/2011; NL LV fxn by echo 10/04/2011  . Tobacco abuse     20 pack yr history - currently smoking 1ppd  . Chest pain     in setting of SVT  . SVT (supraventricular tachycardia)     10/04/2011 - broke w vagal maneuvers    Past Surgical History  Procedure Date  . Cardiac catheterization 10/04/11    History reviewed. No pertinent family history.  History  Substance Use Topics  . Smoking status: Current Everyday Smoker -- 1.0 packs/day for 20 years    Types: Cigarettes  . Smokeless tobacco: Never Used  . Alcohol Use: No      Review of Systems  Constitutional: Negative.   HENT: Negative.   Eyes: Negative.   Respiratory: Negative.   Cardiovascular: Positive for chest pain and palpitations.  Genitourinary: Negative.   Musculoskeletal: Negative.   Skin:  Negative.   Neurological: Negative.   Psychiatric/Behavioral: Negative.     Allergies  Review of patient's allergies indicates no known allergies.  Home Medications   Current Outpatient Rx  Name Route Sig Dispense Refill  . ACETAMINOPHEN 500 MG PO TABS Oral Take 500-1,000 mg by mouth every 6 (six) hours as needed. For pain     . METOPROLOL TARTRATE 50 MG PO TABS Oral Take 50 mg by mouth as needed.        BP 113/71  Pulse 83  Temp(Src) 98.4 F (36.9 C) (Oral)  Resp 18  SpO2 100%  Physical Exam  Nursing note and vitals reviewed. Constitutional: He is oriented to person, place, and time. He appears well-developed and well-nourished. No distress.  HENT:  Head: Normocephalic and atraumatic.  Right Ear: External ear normal.  Left Ear: External ear normal.  Mouth/Throat: Oropharynx is clear and moist.  Eyes: Conjunctivae and EOM are normal. Pupils are equal, round, and reactive to light.  Neck: Neck supple. No thyromegaly present.  Cardiovascular: Normal rate, regular rhythm and normal heart sounds.   Pulmonary/Chest: Effort normal and breath sounds normal.  Abdominal: Soft. Bowel sounds are normal.  Musculoskeletal: Normal range of motion.  Neurological: He is alert and oriented to person, place, and time.       Sensory and motor intact.  Skin: Skin is warm and dry.  Psychiatric: He has a normal mood and affect. His behavior is normal.    ED Course  Procedures (including critical care time)  Date: 10/30/2011  Rate: 91  Rhythm: normal sinus rhythm  QRS Axis: normal  Intervals: normal  ST/T Wave abnormalities: normal  Conduction Disutrbances: Has delta waves most notable in precordial leads.  Narrative Interpretation: Abnormal EKG--WPW  Old EKG Reviewed: unchanged  3:51 PM Patient was seen and had physical examination. EKG showed Wolff-Parkinson-White syndrome. Old charts were reviewed. When he was hospitalized, he was advised to try metoprolol as medical  treatment. If that fails, he would need an ablation. I contacted  [facility],Christopher Brion Aliment, P.A.-C. And they will see and admit pt.   1. Wolf-Parkinson-White syndrome   2. Palpitations      7:32 PM Patient was seen by [facility].  Pt wishes to be discharged.  They have adjusted his medications, and they plan to bring him back for an outpatient ablation.    Carleene Cooper III, MD 10/30/11 0454  Carleene Cooper III, MD 10/30/11 [phone]

## 2011-10-30 NOTE — H&P (Signed)
 Patient ID: Albert Robles MRN: [id number], DOB/AGE: 03/24/1976   Admit date: 10/30/2011   Primary Cardiologist: D. Mclean/G. Taylor  Pt. Profile:   36 y/o male w/ h/o WPW, admitted in Dec for SVT w/ abnl ECG, who presents 2/2 recurrent, symptomatic tachypalps.  Problem List: Past Medical History  Diagnosis Date  . Wolf-Parkinson-White syndrome     Diagnosed 10/04/2011; NL LV fxn by echo 10/04/2011  . Tobacco abuse     20 pack yr history - currently smoking 1ppd  . Chest pain     in setting of SVT, NL Cath 10/04/2011  . SVT (supraventricular tachycardia)     10/04/2011 - broke w vagal maneuvers     Allergies: No Known Allergies  HPI:   36 y/o male with the above problem list.  He was admitted to [facility] in December with SVT in the setting of WPW.  His SVT broke by bearing down with EMS.  In the ED, his CE were negative however he was noted to develop inferolateral ST elevation and deep ant TWI.  Decision was made to cath him, which showed NL cors.  Echo was NL.  He was seen by EP w/ rec for a trial of prn metoprolol and eventual RFCA if med Rx alone failed.  Since D/C, pt has had at least 6 episodes of tachypalpitations.  Usually, Ss last about 10-15 mins and resolve with bearing down however on more than 1 occasion, Ss lasted longer.  He has tried taking both Metoprolol 50mg and 25mg, on separate occasions, but both doses make him significantly fatigued.  Today @ approx noon, he had a recurrent episode of tachypalps.  He tried bearing down but this did not signif slow rate or change Ss of LH and chest pain.  He drove to work where a co-worker checked his BP, which was in the 150's.  His pulse was reportedly thready and difficult to count.  EMS was called around 2:30p and with bearing down, pt says the rhythm broke prior to EMS arrival.  He was taken to the [facility] and is currently w/o complaint.  He wishes to be discharged.   Home Medications No current facility-administered medications on  file as of 10/30/2011.   Medications Prior to Admission  Medication Sig Dispense Refill  . acetaminophen (TYLENOL) 500 MG tablet Take 500-1,000 mg by mouth every 6 (six) hours as needed. For pain       . metoprolol (LOPRESSOR) 50 MG tablet Take 50 mg by mouth as needed.           Family History  Mother is alive and well.  He doesn't know anything about his biological father.  History   Social History  . Marital Status: Single    Spouse Name: N/A    Number of Children: N/A  . Years of Education: N/A   Occupational History  . Security Guard [employer]   Social History Main Topics  . Smoking status: Current Everyday Smoker -- 1.0 packs/day for 20 years    Types: Cigarettes  . Smokeless tobacco: Never Used  . Alcohol Use: No  . Drug Use: No  . Sexually Active: Yes   Other Topics Concern  . Not on file   Social History Narrative   Lives with long-term girlfriend and young child     Review of Systems: General: negative for chills, fever, night sweats or weight changes.  Cardiovascular: +++tachypalps and assoc chest pain, dyspnea, and LH.  No edema, orthopnea, palpitations,   paroxysmal nocturnal dyspnea. Dermatological: negative for rash Respiratory: negative for cough or wheezing Urologic: negative for hematuria Abdominal: negative for nausea, vomiting, diarrhea, bright red blood per rectum, melena, or hematemesis Neurologic: negative for visual changes, syncope. All other systems reviewed and are otherwise negative except as noted above.  Physical Exam: Blood pressure 113/71, pulse 83, temperature 98.4 F (36.9 C), temperature source Oral, resp. rate 18, SpO2 100.00%.  General: Well developed, well nourished, in no acute distress. Head: Normocephalic, atraumatic, sclera non-icteric, no xanthomas, nares are without discharge.  Neck: Supple without bruits or JVD. Lungs:  Resp regular and unlabored, CTA. Heart: RRR no s3, s4, or murmurs. Abdomen: Soft,  non-tender, non-distended, BS + x 4.  Msk:  Strength and tone appears normal for age. Extremities: No clubbing, cyanosis or edema. DP/PT/Radials 2+ and equal bilaterally. Neuro: Alert and oriented X 3. Moves all extremities spontaneously. Psych: Normal affect.   Labs:  Pending.   EKG:  RSR, 93, early repol  ASSESSMENT AND PLAN:   1.  Symptomatic PSVT in setting of WPW:  Pt w/ multiple spells of tachypalps since admission in December.  He doesn't tolerate BB well 2/2 profound fatigue.  He would like to be eval for RFCA.  We will check his labs now.  He wishes to be d/c from ED if labs turn out ok, and this seems reasonable.  He is sched to see Dr. Taylor @ the end of the month, but given recurrence of Ss, we will try and get him sched for SVT RFCA as an outpt.    2.  Tob. Abuse:  Cessation advised.  He cont to smoke 1ppd.  He says he is not ready to quit yet.   Signed, Christopher Berge, NP 10/30/2011, 4:59 PM   Attending note:  Patient seen and examined. Reviewed recent records and database as recorded by Mr. Berge. Situation was discussed with the patient and his family present.   He has a history of recently diagnosed Wolff-Parkinson-White syndrome, normal LVEF, and normal coronary arteries. He presented initially in December, underwent cardiac catheterization at that time, and EP evaluation with Dr. Klein, subsequently Dr. Taylor. Decision at that point was to try medical therapy and observation first, with use of PRN metoprolol. Since that time patient has had at least 6 episodes of rapid palpitations, and has had difficulty tolerating the metoprolol at 50 mg, even feels "washed out" when he has used 25 mg. Today he had a particularly prolonged episode of palpitations lasting approximately 3 hours, ultimately broken after several Valsalva maneuvers. He had gone to work with these symptoms, was ultimately transported via EMS to the ER, having already converted.  On my examination he  is quite comfortable, complains of no chest pain or palpitations, states that he would like to go home, indicating that he has no one to take care of his dogs this evening (states wife is out of town). BP 113/71, heart rate in the 70s in sinus rhythm. No elevated JVP, lungs clear, cor with regular rate and rhythm and no rub, no peripheral edema.  Labwork is pending at this time. His ECG shows sinus rhythm at 93 with delta waves.  We discussed the situation, and I made it clear that we could admit him to the hospital tonight and have him seen by Dr. Klein tomorrow for discussion of radiofrequency ablation. As noted, he prefers to go home, stating that he needs to take care of his dogs and has no one else to do   it for him. Need to followup on lab work first, and if there are no acute abnormalities, would recommend that he take his Lopressor regularly at 25 mg twice a day in the short-term. Will leave a message with Amber on the EP service for the patient to be contacted tomorrow and make subsequent arrangements for radiofrequency ablation with Dr. Klein in the near future. He voiced comfort with this.  Samuel G. McDowell, M.D., F.A.C.C.     

## 2011-10-30 NOTE — ED Notes (Signed)
ekg given to dr. Davidson. 

## 2011-10-30 NOTE — ED Notes (Signed)
Pt experienced chest tightness while at work with tachypnea. Hx of w-p-w dx in November. Pain decreased with nitro and asa given pta. Pt presents hyperventilating.

## 2011-10-31 ENCOUNTER — Telehealth: Payer: Self-pay | Admitting: Internal Medicine

## 2011-10-31 DIAGNOSIS — I456 Pre-excitation syndrome: Secondary | ICD-10-CM

## 2011-10-31 NOTE — Telephone Encounter (Signed)
FU Call: Pt calling stating that he needs return to work note. Please return pt call to discuss further. Pt is suppose to be at work at [employer] today.

## 2011-10-31 NOTE — Telephone Encounter (Signed)
New Msg: pt calling to schedule ablation. Please return pt call to discuss further.

## 2011-10-31 NOTE — Telephone Encounter (Signed)
Spoke with patient and will call his boss tomorrow [phone]  Sharyon Cable to obtain a fax number for the note to be sent  I have offered 11/05/11 for the SVT ablation and will set this up tomorrow and call him back with a time

## 2011-11-01 ENCOUNTER — Other Ambulatory Visit: Payer: Self-pay | Admitting: *Deleted

## 2011-11-01 ENCOUNTER — Other Ambulatory Visit (INDEPENDENT_AMBULATORY_CARE_PROVIDER_SITE_OTHER): Payer: Self-pay | Admitting: *Deleted

## 2011-11-01 ENCOUNTER — Encounter: Payer: Self-pay | Admitting: *Deleted

## 2011-11-01 ENCOUNTER — Telehealth: Payer: Self-pay | Admitting: Internal Medicine

## 2011-11-01 DIAGNOSIS — I456 Pre-excitation syndrome: Secondary | ICD-10-CM

## 2011-11-01 LAB — BASIC METABOLIC PANEL
Chloride: 108 mEq/L (ref 96–112)
GFR: 80.3 mL/min (ref >60.00)
Glucose, Bld: 82 mg/dL (ref 70–99)
Potassium: 4.3 mEq/L (ref 3.5–5.1)
Sodium: 141 mEq/L (ref 135–145)

## 2011-11-01 LAB — CBC WITH DIFFERENTIAL/PLATELET
Basophils Absolute: 0 10*3/uL (ref 0.0–0.1)
Eosinophils Relative: 1.8 % (ref 0.0–5.0)
HCT: 40.9 % (ref 39.0–52.0)
Hemoglobin: 14.1 g/dL (ref 13.0–17.0)
Lymphs Abs: 1.4 10*3/uL (ref 0.7–4.0)
Monocytes Relative: 6.2 % (ref 3.0–12.0)
Neutro Abs: 6.6 10*3/uL (ref 1.4–7.7)
RDW: 13 % (ref 11.5–14.6)

## 2011-11-01 NOTE — Telephone Encounter (Signed)
Called patient back and left a message for him to call me back.  He will need to have lab work and pick up his instruction sheet

## 2011-11-01 NOTE — Telephone Encounter (Signed)
Called and spoke with Sharyon Cable at [employer].  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  I will fax over note to Mr. Jason Fila with this information.  Also let him know that Mr Cosma is going to be having a procedure on 11/05/2011 with Dr Ladona Ridgel and will be out of work for a week after the procedure

## 2011-11-01 NOTE — Telephone Encounter (Signed)
New problem:  Per after hour voice mail on 1/1  " tell nurse patient needs to have ablation set up needs to talk to amber 1st. To see if she called patient.

## 2011-11-01 NOTE — Telephone Encounter (Signed)
Patient aware and is going to come in for labs and his instruction sheet today

## 2011-11-01 NOTE — Telephone Encounter (Signed)
Already set up and patient aware

## 2011-11-02 ENCOUNTER — Encounter (HOSPITAL_COMMUNITY): Payer: Self-pay | Admitting: Respiratory Therapy

## 2011-11-05 ENCOUNTER — Encounter (HOSPITAL_COMMUNITY): Payer: Self-pay | Admitting: General Practice

## 2011-11-05 ENCOUNTER — Encounter (HOSPITAL_COMMUNITY)
Admission: RE | Disposition: A | Discharge status: Home / Self Care | Payer: Self-pay | Source: Ambulatory Visit | Attending: Internal Medicine

## 2011-11-05 ENCOUNTER — Ambulatory Visit (HOSPITAL_COMMUNITY)
Admission: RE | Admit: 2011-11-05 | Discharge: 2011-11-06 | Disposition: A | Discharge status: Home / Self Care | Payer: Self-pay | Source: Ambulatory Visit | Attending: Internal Medicine | Admitting: Internal Medicine

## 2011-11-05 DIAGNOSIS — I456 Pre-excitation syndrome: Secondary | ICD-10-CM

## 2011-11-05 DIAGNOSIS — F17200 Nicotine dependence, unspecified, uncomplicated: Secondary | ICD-10-CM | POA: Insufficient documentation

## 2011-11-05 DIAGNOSIS — R079 Chest pain, unspecified: Secondary | ICD-10-CM | POA: Insufficient documentation

## 2011-11-05 HISTORY — PX: CARDIAC CATHETERIZATION: SHX172

## 2011-11-05 HISTORY — PX: CARDIAC ELECTROPHYSIOLOGY MAPPING AND ABLATION: SHX1292

## 2011-11-05 HISTORY — PX: SUPRAVENTRICULAR TACHYCARDIA ABLATION: SHX5492

## 2011-11-05 LAB — POCT ACTIVATED CLOTTING TIME: Activated Clotting Time: 160 seconds

## 2011-11-05 LAB — PROTIME-INR: Prothrombin Time: 14.8 seconds (ref 11.6–15.2)

## 2011-11-05 LAB — APTT: aPTT: 31 seconds (ref 24–37)

## 2011-11-05 SURGERY — SUPRAVENTRICULAR TACHYCARDIA ABLATION
Anesthesia: LOCAL

## 2011-11-05 MED ORDER — MIDAZOLAM HCL 5 MG/5ML IJ SOLN
INTRAMUSCULAR | Status: AC
  Filled 2011-11-05: qty 5

## 2011-11-05 MED ORDER — BUPIVACAINE HCL (PF) 0.25 % IJ SOLN
INTRAMUSCULAR | Status: AC
  Filled 2011-11-05: qty 30

## 2011-11-05 MED ORDER — SODIUM CHLORIDE 0.9 % IV SOLN: 250.0000 mL | INTRAVENOUS | Status: AC | PRN

## 2011-11-05 MED ORDER — HEPARIN (PORCINE) IN NACL 2-0.9 UNIT/ML-% IJ SOLN
INTRAMUSCULAR | Status: AC
  Filled 2011-11-05: qty 1000

## 2011-11-05 MED ORDER — ONDANSETRON HCL 4 MG/2ML IJ SOLN: 4.0000 mg | Freq: Four times a day (QID) | INTRAMUSCULAR | Status: DC | PRN

## 2011-11-05 MED ORDER — ACETAMINOPHEN 325 MG PO TABS
650.0000 mg | ORAL_TABLET | ORAL | Status: DC | PRN
  Administered 2011-11-05: 650 mg via ORAL
  Filled 2011-11-05: qty 1
  Filled 2011-11-05: qty 2

## 2011-11-05 MED ORDER — FENTANYL CITRATE 0.05 MG/ML IJ SOLN
INTRAMUSCULAR | Status: AC
  Filled 2011-11-05: qty 2

## 2011-11-05 MED ORDER — NICOTINE 14 MG/24HR TD PT24
14.0000 mg | MEDICATED_PATCH | TRANSDERMAL | Status: DC
  Administered 2011-11-05: 14 mg via TRANSDERMAL
  Filled 2011-11-05 (×2): qty 1

## 2011-11-05 MED ORDER — HEPARIN SODIUM (PORCINE) 1000 UNIT/ML IJ SOLN
INTRAMUSCULAR | Status: AC
  Filled 2011-11-05: qty 1

## 2011-11-05 MED ORDER — SODIUM CHLORIDE 0.9 % IV SOLN
INTRAVENOUS | Status: DC
  Administered 2011-11-05: 10 mL/h via INTRAVENOUS

## 2011-11-05 MED ORDER — MORPHINE SULFATE 2 MG/ML IJ SOLN
2.0000 mg | INTRAMUSCULAR | Status: DC | PRN
  Administered 2011-11-05: 2 mg via INTRAVENOUS
  Filled 2011-11-05: qty 1

## 2011-11-05 MED ORDER — INFLUENZA VIRUS VACC SPLIT PF IM SUSP
0.5000 mL | INTRAMUSCULAR | Status: AC
  Administered 2011-11-06: 0.5 mL via INTRAMUSCULAR
  Filled 2011-11-05 (×2): qty 0.5

## 2011-11-05 NOTE — Op Note (Signed)
EPS/RFA of a Left lateral AP performed without immediate complication. W#[id number].

## 2011-11-05 NOTE — Interval H&P Note (Signed)
History and Physical Interval Note:  11/05/2011 10:32 AM  Albert Robles  has presented today for surgery, with the diagnosis of wpw  The various methods of treatment have been discussed with the patient and family. After consideration of risks, benefits and other options for treatment, the patient has consented to  Procedure(s): SUPRAVENTRICULAR TACHYCARDIA ABLATION as a surgical intervention .  The patients' history has been reviewed, patient examined, no change in status, stable for surgery.  I have reviewed the patients' chart and labs.  Questions were answered to the patient's satisfaction.     Lewayne Bunting

## 2011-11-05 NOTE — H&P (View-Only) (Signed)
Patient ID: Albert Robles MRN: [id number], DOB/AGE: 04/20/1976   Admit date: 10/30/2011   Primary Cardiologist: D. Mclean/G. Taylor  Pt. Profile:   36 y/o male w/ h/o WPW, admitted in Dec for SVT w/ abnl ECG, who presents 2/2 recurrent, symptomatic tachypalps.  Problem List: Past Medical History  Diagnosis Date  . Wolf-Parkinson-White syndrome     Diagnosed 10/04/2011; NL LV fxn by echo 10/04/2011  . Tobacco abuse     20 pack yr history - currently smoking 1ppd  . Chest pain     in setting of SVT, NL Cath 10/04/2011  . SVT (supraventricular tachycardia)     10/04/2011 - broke w vagal maneuvers     Allergies: No Known Allergies  HPI:   36 y/o male with the above problem list.  He was admitted to [facility] in December with SVT in the setting of WPW.  His SVT broke by bearing down with EMS.  In the ED, his CE were negative however he was noted to develop inferolateral ST elevation and deep ant TWI.  Decision was made to cath him, which showed NL cors.  Echo was NL.  He was seen by EP w/ rec for a trial of prn metoprolol and eventual RFCA if med Rx alone failed.  Since D/C, pt has had at least 6 episodes of tachypalpitations.  Usually, Ss last about 10-15 mins and resolve with bearing down however on more than 1 occasion, Ss lasted longer.  He has tried taking both Metoprolol 50mg  and 25mg , on separate occasions, but both doses make him significantly fatigued.  Today @ approx noon, he had a recurrent episode of tachypalps.  He tried bearing down but this did not signif slow rate or change Ss of LH and chest pain.  He drove to work where a co-worker checked his BP, which was in the 150's.  His pulse was reportedly thready and difficult to count.  EMS was called around 2:30p and with bearing down, pt says the rhythm broke prior to EMS arrival.  He was taken to the [facility] ED and is currently w/o complaint.  He wishes to be discharged.   Home Medications No current facility-administered medications on  file as of 10/30/2011.   Medications Prior to Admission  Medication Sig Dispense Refill  . acetaminophen (TYLENOL) 500 MG tablet Take 500-1,000 mg by mouth every 6 (six) hours as needed. For pain       . metoprolol (LOPRESSOR) 50 MG tablet Take 50 mg by mouth as needed.           Family History  Mother is alive and well.  He doesn't know anything about his biological father.  History   Social History  . Marital Status: Single    Spouse Name: N/A    Number of Children: N/A  . 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 Smoking status: Current Everyday Smoker -- 1.0 packs/day for 20 years    Types: Cigarettes  . Smokeless tobacco: Never Used  . Alcohol Use: No  . Drug Use: No  . Sexually Active: Yes   Other Topics Concern  . Not on file   Social History Narrative   Lives with long-term girlfriend and young child     Review of Systems: General: negative for chills, fever, night sweats or weight changes.  Cardiovascular: +++tachypalps and assoc chest pain, dyspnea, and LH.  No edema, orthopnea, palpitations,  paroxysmal nocturnal dyspnea. Dermatological: negative for rash Respiratory: negative for cough or wheezing Urologic: negative for hematuria Abdominal: negative for nausea, vomiting, diarrhea, bright red blood per rectum, melena, or hematemesis Neurologic: negative for visual changes, syncope. All other systems reviewed and are otherwise negative except as noted above.  Physical Exam: Blood pressure 113/71, pulse 83, temperature 98.4 F (36.9 C), temperature source Oral, resp. rate 18, SpO2 100.00%.  General: Well developed, well nourished, in no acute distress. Head: Normocephalic, atraumatic, sclera non-icteric, no xanthomas, nares are without discharge.  Neck: Supple without bruits or JVD. Lungs:  Resp regular and unlabored, CTA. Heart: RRR no s3, s4, or murmurs. Abdomen: Soft,  non-tender, non-distended, BS + x 4.  Msk:  Strength and tone appears normal for age. Extremities: No clubbing, cyanosis or edema. DP/PT/Radials 2+ and equal bilaterally. Neuro: Alert and oriented X 3. Moves all extremities spontaneously. Psych: Normal affect.   Labs:  Pending.   EKG:  RSR, 93, early repol  ASSESSMENT AND PLAN:   1.  Symptomatic PSVT in setting of WPW:  Pt w/ multiple spells of tachypalps since admission in December.  He doesn't tolerate BB well 2/2 profound fatigue.  He would like to be eval for RFCA.  We will check his labs now.  He wishes to be d/c from ED if labs turn out ok, and this seems reasonable.  He is sched to see Dr. Ladona Ridgel @ the end of the month, but given recurrence of Ss, we will try and get him sched for SVT RFCA as an outpt.    2.  Tob. Abuse:  Cessation advised.  He cont to smoke 1ppd.  He says he is not ready to quit yet.   Signed, [name], NP 10/30/2011, 4:59 PM   Attending note:  Patient seen and examined. Reviewed recent records and database as recorded by Mr. Brion Aliment. Situation was discussed with the patient and his family present.   He has a history of recently diagnosed Wolff-Parkinson-White syndrome, normal LVEF, and normal coronary arteries. He presented initially in December, underwent cardiac catheterization at that time, and EP evaluation with Dr. Graciela Husbands, subsequently Dr. Ladona Ridgel. Decision at that point was to try medical therapy and observation first, with use of PRN metoprolol. Since that time patient has had at least 6 episodes of rapid palpitations, and has had difficulty tolerating the metoprolol at 50 mg, even feels "washed out" when he has used 25 mg. Today he had a particularly prolonged episode of palpitations lasting approximately 3 hours, ultimately broken after several Valsalva maneuvers. He had gone to work with these symptoms, was ultimately transported via EMS to the ER, having already converted.  On my examination he  is quite comfortable, complains of no chest pain or palpitations, states that he would like to go home, indicating that he has no one to take care of his dogs this evening (states wife is out of town). BP 113/71, heart rate in the 70s in sinus rhythm. No elevated JVP, lungs clear, cor with regular rate and rhythm and no rub, no peripheral edema.  Labwork is pending at this time. His ECG shows sinus rhythm at 93 with delta waves.  We discussed the situation, and I made it clear that we could admit him to the hospital tonight and have him seen by Dr. Graciela Husbands tomorrow for discussion of radiofrequency ablation. As noted, he prefers to go home, stating that he needs to take care of his dogs and has no one else to do  it for him. Need to followup on lab work first, and if there are no acute abnormalities, would recommend that he take his Lopressor regularly at 25 mg twice a day in the short-term. Will leave a message with Amber on the EP service for the patient to be contacted tomorrow and make subsequent arrangements for radiofrequency ablation with Dr. Graciela Husbands in the near future. He voiced comfort with this.  Jonelle Sidle, M.D., F.A.C.C.

## 2011-11-05 NOTE — Progress Notes (Signed)
Slight bleeding on the dressing was noted at 1545 so a circle was drawn. As it was checked and up until 1615 it was still within the circle. At 1630 it was a little outside the circle and at 1645 it was outside the circle a little more and looked as if there was a possible hematoma so the cath lab RN as well as Dr Brion Aliment were notified. The RN from the cath lab changed the dressing and checked it and the pt was negative for the hematoma and was not actively bleeding under the dressing. The pt was ordere 2mg  of Morphine for the pain. Sanda Linger

## 2011-11-06 ENCOUNTER — Other Ambulatory Visit: Payer: Self-pay

## 2011-11-06 DIAGNOSIS — I456 Pre-excitation syndrome: Secondary | ICD-10-CM

## 2011-11-06 NOTE — Op Note (Signed)
NAMEERRON, WENGERT NO.:  [id number]  MEDICAL RECORD NO.:  [id number]  LOCATION:  3708                         FACILITY:  [facility]  PHYSICIAN:  Doylene Canning. Ladona Ridgel, MD    DATE OF BIRTH:  1976/06/01  DATE OF PROCEDURE:  11/05/2011 DATE OF DISCHARGE:                              OPERATIVE REPORT   PROCEDURE PERFORMED:  Electrophysiologic study and catheter ablation of a manifest left lateral accessory pathway.  INTRODUCTION:  The patient is a 36 year old man with a history of recurrent tachy palpitations who was placed initially on beta-blockers for which he was intolerant.  He is now referred for catheter ablation.  PROCEDURE:  After informed consent was obtained, the patient was taken to the diagnostic EP lab in a fasting state.  After usual preparation and draping, intravenous fentanyl and midazolam was given for sedation. A 6-French hexapolar catheter was inserted percutaneously in the right jugular vein and advanced to the coronary sinus.  A 6-French quadripolar catheter was inserted percutaneously in the right femoral vein and advanced to the RV.  A 6-French quadripolar catheter was inserted percutaneously in the right femoral vein and advanced to the His bundle region.  After measuring the basic intervals, rapid ventricular pacing was carried out demonstrating eccentric nondecremental atrial activation.  The Wenckebach cycle length through the accessory pathway was 290 msec.  Next programmed ventricular stimulation was carried out from the right ventricle at base drive cycle length of 161 msec.  The S1- S2 interval stepwise decreased down to 210 msec, where the pathway ERP was demonstrated.  During programmed ventricular stimulation, the atrial activation was nonmidline (eccentric and nondecremental).  Next, rapid atrial pacing was carried out from the coronary sinus and stepwise decreased down to 280 msec.  During rapid atrial pacing, there was maximal  pre-excitation.  The pathway antegrade Wenckebach cycle length was 300 msec.  It should be noted that AV block was demonstrated as well through the AV node.  Next programmed atrial stimulation was carried out from the coronary sinus at a base drive cycle length of 096 msec.  The S1-S2 interval stepwise decreased down to 300 msec where the pathway as well as AV node ERP was demonstrated.  During programmed atrial stimulation, there were no AH jumps, no echo beats, no inducible SVT. At this point, a 7-French quadripolar ablation catheter was maneuvered into the right femoral artery and advanced retrograde across the aortic valve.  Mapping was carried out along the mitral valve anulus both on the ventricular as well as atrial insertion.  A total of 3 RF energy applications were delivered.  During the third RF energy application, there was evidence of pathway block.  The patient was observed for 45 minutes and had no recurrent accessory pathway conduction.  At this point, the catheters were removed.  Hemostasis was assured and the patient was returned to his room in satisfactory condition.  COMPLICATIONS:  There were no immediate procedure complications.  RESULTS:  A.  Baseline ECG demonstrates sinus rhythm with manifest WPW syndrome. B.  Baseline intervals.  Sinus node cycle length was 794 msec.  QRS duration was 90 msec.  The HV interval was negative 18  msec prior to ablation.  After ablation, the HV interval was 38 msec. C.  Rapid ventricular pacing.  Rapid ventricular pacing was carried out from the right ventricle and stepwise decreased down to 209 msec where the pathway Wenckebach retrograde cycle length was demonstrated. Following ablation, rapid ventricular pacing demonstrated VA Wenckebach cycle length of 420 msec through the AV node. D.  Programmed ventricular stimulation.  Programmed ventricular stimulation was carried out from the right ventricle at a base drive cycle length of  161 msec.  The S1-S2 interval stepwise decreased down to 210 msec where the retrograde pathway ERP was demonstrated.  During programmed ventricular stimulation, the atrial activation was midline and decremental. E.  Rapid atrial pacing.  Rapid atrial pacing was carried out from the coronary sinus and right atrium demonstrated AV Wenckebach cycle length of 300 msec.  This was antegrade to the pathway. F.  Programmed atrial stimulation.  Programmed atrial stimulation was carried out from the coronary sinus at a base drive cycle length of 096 msec and stepwise decreased down to 300 msec where the pathway antegrade ERP was demonstrated.  During programmed atrial stimulation, there was no inducible SVT. G.  Arrhythmias observed.  There was no inducible SVT.  There was manifest left lateral accessory pathway conduction. H.  Mapping.  Mapping of the accessory pathway demonstrated a left lateral accessory pathway with earliest atrial activation to be at about 4 o'clock on the mitral valve anulus. 1. RF Energy application.  Total of 3 RF energy applications were     delivered.  Following the third RF energy application, there was no     evidence of any accessory pathway conduction and no inducible SVT.  CONCLUSIONS:  Study demonstrates successful electrophysiologic study and RF catheter ablation of a manifest left lateral accessory pathway with a total of 3 RF energy applications delivered.  Following ablation, the patient was observed for 45 minutes and had no evidence of any recurrent accessory pathway conduction.     Doylene Canning. Ladona Ridgel, MD     GWT/MEDQ  D:  11/05/2011  T:  11/06/2011  Job:  [id number]

## 2011-11-06 NOTE — Discharge Summary (Signed)
ELECTROPHYSIOLOGY PROCEDURE DISCHARGE SUMMARY    Patient ID: Albert Robles,  MRN: [id number], DOB/AGE: January 19, 1976 36 y.o.  Admit date: 11/05/2011 Discharge date: 11/06/2011  Primary Cardiologist: Lewayne Bunting, MD  Primary Discharge Diagnosis:  WPW, status post ablation this admission  Secondary Discharge Diagnosis:  1.  Tobacco abuse 2.  Chest pain in the setting of SVT- normal cath on 10-04-2011  Procedures This Admission:  1.  Electrophysiology study and radiofrequency catheter ablation of a manifest left lateral accessory pathway on 11-05-2011 by Dr Ladona Ridgel.  The patient had no early apparent complications.  Brief HPI: Albert Robles is a 36 year old male with a recent history of tachy palpitations. He was admitted to [facility] in December with SVT in the setting of WPW. His SVT broke by bearing down with EMS. In the ED, his CE were negative however he was noted to develop inferolateral ST elevation and deep ant TWI. Decision was made to cath him, which showed NL cors. Echo was NL. He was seen by EP w/ rec for a trial of prn metoprolol and eventual RFCA if med Rx alone failed. Since D/C, pt has had at least 6 episodes of tachypalpitations. Usually, Ss last about 10-15 mins and resolve with bearing down however on more than 1 occasion, Ss lasted longer. He has tried taking both Metoprolol 50mg  and 25mg , on separate occasions, but both doses make him significantly fatigued. He presented to the ER with recurrent SVT on 10-30-2011, at that time it was felt that he had failed medical therapy and ablation was warranted.  He was scheduled for elective ablation of WPW on 11-05-2011.  Hospital Course:  The patient was admitted on 11-05-2011 for planned ablation of WPW.  This was carried out by Dr Ladona Ridgel with details as outlined above.  He was monitored on telemetry overnight which demonstrated sinus rhythm with no further SVT.  EKG was obtained which demonstrated that previously present pre-excitation was resolved.   His groin incision was without hematoma or bruit.  Dr Ladona Ridgel examined the patient and considered him stable for discharge. Dr Ladona Ridgel advised the patient he can discontinue his Metoprolol in the setting of successful ablation.   Discharge Vitals: Blood pressure 98/52, pulse 80, temperature 97.7 F (36.5 C), temperature source Oral, resp. rate 18, height 6\' 11"  (2.108 m), weight 155 lb (70.308 kg), SpO2 98.00%.    Labs:   Lab Results  Component Value Date   WBC 8.7 11/01/2011   HGB 14.1 11/01/2011   HCT 40.9 11/01/2011   MCV 96.3 11/01/2011   PLT 196.0 11/01/2011    Lab 11/01/11 1246  NA 141  K 4.3  CL 108  CO2 28  BUN 10  CREATININE 1.1  CALCIUM 9.4  PROT --  BILITOT --  ALKPHOS --  ALT --  AST --  GLUCOSE 82    Discharge Medications: Current Discharge Medication List    CONTINUE these medications which have NOT CHANGED   Details  acetaminophen (TYLENOL) 500 MG tablet Take 500-1,000 mg by mouth every 6 (six) hours as needed. For pain       STOP taking these medications     metoprolol (LOPRESSOR) 50 MG tablet         Disposition:  Discharge Orders    Future Appointments: Provider: Department: Dept Phone: Center:   11/22/2011 11:45 AM Lewayne Bunting, MD [facility] [phone] [facility]     Future Orders Please Complete By Expires   Diet - low sodium heart healthy  Increase activity slowly      Comments:   Please see attached sheet for instructions on wound care, activity, and bathing.       Follow-up Information    Follow up with Lewayne Bunting, MD. (11/22/11 at 11:45am)    Contact information:   [address] [place] [address] [phone]          Duration of Discharge Encounter: Greater than 30 minutes including physician time.  Signed, Gypsy Balsam, RN, BSN 11/06/2011, 10:46 AM

## 2011-11-06 NOTE — Progress Notes (Signed)
Pt smokes 1 ppd and says he is in contemplation stage about quitting. He wants to quit but not in the immediate future. Pt verbalizes understanding of risk factors. Referred to 1-800 quit now for f/u and support. Discussed oral fixation substitutes, second hand smoke and in home smoking policy. Reviewed and gave pt Written education/contact information.

## 2011-11-06 NOTE — Progress Notes (Signed)
   ELECTROPHYSIOLOGY ROUNDING NOTE    Patient Name: Albert Robles Date of Encounter: 11-06-2011    SUBJECTIVE:Feels well, no chest pain or shortness of breath  TELEMETRY: Reviewed telemetry pt in sinus rhtyhm with no pre-excitation. Filed Vitals:   11/05/11 0901 11/05/11 1445 11/05/11 2100 11/06/11 0500  BP: 118/69 112/63 100/55 98/52  Pulse: 81 73 82 80  Temp: 98 F (36.7 C) 97.9 F (36.6 C) 98 F (36.7 C) 97.7 F (36.5 C)  TempSrc: Oral Oral Oral Oral  Resp: 20 17 18 18   Height: 6\' 11"  (2.108 m)     Weight: 155 lb (70.308 kg)     SpO2: 100% 98% 98% 98%    EKG- sinus rhythm with no pre-excitation  PHYSICAL EXAM Groin incision without hematoma or bruit  Wound care, restrictions reviewed with patient.  A/P 1. WPW - he is s/p EPS/RFA of a left lateral accessory pathway. Doing well with no evidence of pre-excitation after ablation.   11/06/2011 10:16 AM

## 2011-11-07 ENCOUNTER — Telehealth: Payer: Self-pay | Admitting: Internal Medicine

## 2011-11-07 ENCOUNTER — Encounter: Payer: Self-pay | Admitting: *Deleted

## 2011-11-07 NOTE — Telephone Encounter (Signed)
Will write letter now and call patient to find out fax number to send  [phone]  Sharyon Cable

## 2011-11-07 NOTE — Telephone Encounter (Signed)
New Problem:    Patient called in wanting you to write him a note releasing him to go to work this upcomming Monday 11/12/11. Please call back.

## 2011-11-08 ENCOUNTER — Telehealth: Payer: Self-pay | Admitting: Internal Medicine

## 2011-11-08 ENCOUNTER — Ambulatory Visit (INDEPENDENT_AMBULATORY_CARE_PROVIDER_SITE_OTHER): Payer: Self-pay

## 2011-11-08 ENCOUNTER — Encounter: Payer: Self-pay | Admitting: Cardiology

## 2011-11-08 VITALS — BP 90/60 | HR 77 | Ht 71.0 in | Wt 162.2 lb

## 2011-11-08 DIAGNOSIS — I471 Supraventricular tachycardia: Secondary | ICD-10-CM

## 2011-11-08 DIAGNOSIS — R1909 Other intra-abdominal and pelvic swelling, mass and lump: Secondary | ICD-10-CM

## 2011-11-08 DIAGNOSIS — I498 Other specified cardiac arrhythmias: Secondary | ICD-10-CM

## 2011-11-08 DIAGNOSIS — R1031 Right lower quadrant pain: Secondary | ICD-10-CM

## 2011-11-08 NOTE — Telephone Encounter (Signed)
Pt calling re question on incision site

## 2011-11-08 NOTE — Telephone Encounter (Signed)
Addended by: Dennis Bast F on: 11/08/2011 12:09 PM   Modules accepted: Orders

## 2011-11-08 NOTE — Progress Notes (Signed)
Patient called today c/o pain and swelling in his right groin.  He had an SVT ablation on 11/12/11.  He came over to have this assessed.  I had Sunday Spillers come in and look at his groin.  She assessed and did not hear a bruit.  We offered him to have a doppler of his groin but he is self pay and wishes to go home, rest, and put ice on the area.  If the pain and swelling does not improve he will call me back and agrees to have doppler at that time.

## 2011-11-08 NOTE — Telephone Encounter (Signed)
Spoke with Sunday Spillers regarding patient  She advised to order a doppler of his groining.

## 2011-11-08 NOTE — Telephone Encounter (Signed)
Patient now said that the swelling has increased and is 3 inches wide. Pt will come to the office to have cath site check

## 2011-11-08 NOTE — Telephone Encounter (Signed)
Patient had a cardiac cath. On 11/05/11, patient was d/C next day. Rn assessment of groin incision: without hematoma or bruit. Patient is calling today because he said he has a big about  3 inches  hematoma in his groin cath site, and the bussing has increased since discharge, patient denies any drainage.

## 2011-11-09 NOTE — Progress Notes (Signed)
Patient ID: Albert Robles, male   DOB: 1976-09-24, 36 y.o.   MRN: [id number]  Asked to check patient's groin due to complaints of discomfort. He is s/p recent SVT ablation. Right groin was examined. The area is soft and no firmness noted. No bruit noted. Does have some tenderness but not felt to have a hematoma. Offered duplex. Patient wished to use ice and rest and continue to observe. He has no medical insurance and is self pay.  He is to call if he has any problems in the interim.  Rosalio Macadamia, RN, ANP-C

## 2011-11-22 ENCOUNTER — Encounter: Payer: 59 | Admitting: Internal Medicine

## 2011-11-22 ENCOUNTER — Encounter: Payer: Self-pay | Admitting: Internal Medicine

## 2011-11-29 ENCOUNTER — Encounter: Payer: Self-pay | Admitting: Internal Medicine

## 2012-04-07 ENCOUNTER — Encounter (HOSPITAL_COMMUNITY): Payer: Self-pay | Admitting: *Deleted

## 2012-04-07 ENCOUNTER — Emergency Department (HOSPITAL_COMMUNITY): Payer: Self-pay

## 2012-04-07 ENCOUNTER — Emergency Department (HOSPITAL_COMMUNITY)
Admission: EM | Admit: 2012-04-07 | Discharge: 2012-04-07 | Disposition: A | Discharge status: Home / Self Care | Payer: Self-pay | Attending: Emergency Medicine | Admitting: Emergency Medicine

## 2012-04-07 DIAGNOSIS — R079 Chest pain, unspecified: Secondary | ICD-10-CM | POA: Insufficient documentation

## 2012-04-07 DIAGNOSIS — I456 Pre-excitation syndrome: Secondary | ICD-10-CM | POA: Insufficient documentation

## 2012-04-07 DIAGNOSIS — F17200 Nicotine dependence, unspecified, uncomplicated: Secondary | ICD-10-CM | POA: Insufficient documentation

## 2012-04-07 LAB — CBC
HCT: 38.8 % — ABNORMAL LOW (ref 39.0–52.0)
Hemoglobin: 14.6 g/dL (ref 13.0–17.0)
MCH: 33.3 pg (ref 26.0–34.0)
MCHC: 37.6 g/dL — ABNORMAL HIGH (ref 30.0–36.0)
MCV: 88.4 fL (ref 78.0–100.0)
Platelets: 234 10*3/uL (ref 150–400)
RBC: 4.39 MIL/uL (ref 4.22–5.81)
RDW: 11.8 % (ref 11.5–15.5)
WBC: 5.6 10*3/uL (ref 4.0–10.5)

## 2012-04-07 LAB — BASIC METABOLIC PANEL
BUN: 15 mg/dL (ref 6–23)
CO2: 18 mEq/L — ABNORMAL LOW (ref 19–32)
Calcium: 10 mg/dL (ref 8.4–10.5)
Chloride: 104 mEq/L (ref 96–112)
Creatinine, Ser: 1.15 mg/dL (ref 0.50–1.35)
GFR calc Af Amer: >90 mL/min (ref >90)
GFR calc non Af Amer: 81 mL/min — ABNORMAL LOW (ref >90)
Glucose, Bld: 79 mg/dL (ref 70–99)
Potassium: 3.3 mEq/L — ABNORMAL LOW (ref 3.5–5.1)
Sodium: 140 mEq/L (ref 135–145)

## 2012-04-07 LAB — TROPONIN I: Troponin I: <0.3 ng/mL (ref <0.30)

## 2012-04-07 MED ORDER — SODIUM CHLORIDE 0.9 % IV BOLUS (SEPSIS)
1000.0000 mL | Freq: Once | INTRAVENOUS | Status: AC
  Administered 2012-04-07: 1000 mL via INTRAVENOUS

## 2012-04-07 MED ORDER — POTASSIUM CHLORIDE CRYS ER 20 MEQ PO TBCR
20.0000 meq | EXTENDED_RELEASE_TABLET | Freq: Once | ORAL | Status: AC
  Administered 2012-04-07: 20 meq via ORAL
  Filled 2012-04-07: qty 1

## 2012-04-07 MED ORDER — MORPHINE SULFATE 4 MG/ML IJ SOLN
6.0000 mg | Freq: Once | INTRAMUSCULAR | Status: AC
  Administered 2012-04-07: 6 mg via INTRAVENOUS
  Filled 2012-04-07: qty 2

## 2012-04-07 MED ORDER — ONDANSETRON HCL 4 MG/2ML IJ SOLN
4.0000 mg | Freq: Once | INTRAMUSCULAR | Status: AC
  Administered 2012-04-07: 4 mg via INTRAVENOUS
  Filled 2012-04-07: qty 2

## 2012-04-07 MED ORDER — LORAZEPAM 2 MG/ML IJ SOLN
1.0000 mg | Freq: Once | INTRAMUSCULAR | Status: AC
  Administered 2012-04-07: 1 mg via INTRAVENOUS
  Filled 2012-04-07: qty 1

## 2012-04-07 NOTE — Progress Notes (Signed)
Pt confirms no pcp listed

## 2012-04-07 NOTE — ED Provider Notes (Signed)
History    36 year old male with chest pain. Onset was around 2:00 this afternoon while sitting down at a status. [place] [employer]. Pain is in the center his chest. Relatively constant since onset. Does not radiate. Associated with some mild dyspnea and palpitations. No fevers or chills. No unusual leg pain or swelling. Patient has a history of WPW. Status post ablation in January. Patient had a cardiac catheterization done this past December as part of the workup for WPW. He did not show any evidence of coronary artery disease. This is a smoker. Denies history of blood clot. On review of symptoms pt also reports being bitten by his chiuahua recently on R hand. Mild discomfort at site. No drainage. No fever or chills.  CSN: [id number]  Arrival date & time 04/07/12  1522   First MD Initiated Contact with Patient 04/07/12 1543      Chief Complaint  Patient presents with  . Chest Pain    (Consider location/radiation/quality/duration/timing/severity/associated sxs/prior treatment) HPI  Past Medical History  Diagnosis Date  . Tobacco abuse     20 pack yr history - currently smoking 1ppd  . Chest pain     in setting of SVT, NL Cath 10/04/2011  . Wolf-Parkinson-White syndrome     Diagnosed 10/04/2011; NL LV fxn by echo 10/04/2011  . SVT (supraventricular tachycardia)     10/04/2011 - broke w vagal maneuvers    Past Surgical History  Procedure Date  . Cardiac electrophysiology mapping and ablation 11/05/11  . Cardiac catheterization 10/04/11  . Cardiac catheterization 11/05/11    No family history on file.  History  Substance Use Topics  . Smoking status: Current Everyday Smoker -- 1.0 packs/day for 20 years    Types: Cigarettes  . Smokeless tobacco: Never Used  . Alcohol Use: No      Review of Systems   Review of symptoms negative unless otherwise noted in HPI.   Allergies  Review of patient's allergies indicates no known allergies.  Home Medications    No current outpatient prescriptions on file.  BP 116/97  Pulse 103  Temp(Src) 98.3 F (36.8 C) (Oral)  Resp 31  Wt 160 lb (72.576 kg)  SpO2 100%  Physical Exam  Nursing note and vitals reviewed. Constitutional: He appears well-developed and well-nourished. No distress.       Laying in bed. Mildly anxious appearing.  HENT:  Head: Normocephalic and atraumatic.  Eyes: Conjunctivae are normal. Right eye exhibits no discharge. Left eye exhibits no discharge.  Neck: Neck supple.  Cardiovascular: Normal rate, regular rhythm and normal heart sounds.  Exam reveals no gallop and no friction rub.   No murmur heard. Pulmonary/Chest: Effort normal and breath sounds normal. No respiratory distress. He exhibits no tenderness.       Chest pain is not reproducible.  Abdominal: Soft. He exhibits no distension. There is no tenderness.  Musculoskeletal: He exhibits no edema and no tenderness.       Lower extremities symmetric as compared to each other. No calf tenderness. Negative Homan's. No palpable cords.   Neurological: He is alert.  Skin: Skin is warm and dry.       Small wounds consistent with described mechanism to thenar eminence of R hand. Do not appear to be infected. Neurovascularly intact.  Psychiatric: He has a normal mood and affect. His behavior is normal. Thought content normal.    ED Course  Procedures (including critical care time)  Labs Reviewed  CBC -  Abnormal; Notable for the following:    HCT 38.8 (*)    MCHC 37.6 (*)    All other components within normal limits  BASIC METABOLIC PANEL - Abnormal; Notable for the following:    Potassium 3.3 (*)    CO2 18 (*)    GFR calc non Af Amer 81 (*)    All other components within normal limits  TROPONIN I   Dg Chest 2 View  04/07/2012  *RADIOLOGY REPORT*  Clinical Data: Chest pain and shortness of breath.  CHEST - 2 VIEW  Comparison: 10/04/2011.  Findings: The lungs are clear without focal infiltrate, edema, pneumothorax or  pleural effusion. The cardiopericardial silhouette is within normal limits for size. Imaged bony structures of the thorax are intact. Telemetry leads overlie the chest.  IMPRESSION: Stable.  No acute cardiopulmonary process.  Original Report Authenticated By: ERIC A. MANSELL, M.D.    EKG:  Rhythm: Sinus Rate: 98 Axis: Normal Intervals: Normal ST segments: Nonspecific ST changes   1. Chest pain       MDM  36 year old male with chest pain. Consider ACS but doubt. Patient with a normal cardiac catheterization approximately 6 months ago. History of Wolff-Parkinson-White syndrome s/p ablation. EKG today with no evidence of preexcitation or other concerning changes. Doubt infectious. Doubt PE. Etiology not completely clear but low suspicion for emergent etiology. Feel that patient can be discharged at this time for outpatient followup. Return precautions were discussed.       Raeford Razor, MD 04/08/12 [phone]

## 2012-04-07 NOTE — ED Notes (Signed)
Pt states "have a hx of [employer] syndrome , had surgery for it in January"; pt presents with tachypnea

## 2012-04-18 ENCOUNTER — Ambulatory Visit (INDEPENDENT_AMBULATORY_CARE_PROVIDER_SITE_OTHER): Payer: Self-pay | Admitting: Physician Assistant

## 2012-04-18 ENCOUNTER — Encounter: Payer: Self-pay | Admitting: Physician Assistant

## 2012-04-18 VITALS — BP 110/68 | HR 68 | Ht 71.0 in | Wt 161.8 lb

## 2012-04-18 DIAGNOSIS — R002 Palpitations: Secondary | ICD-10-CM

## 2012-04-18 DIAGNOSIS — R079 Chest pain, unspecified: Secondary | ICD-10-CM

## 2012-04-18 LAB — TSH: TSH: 1.75 u[IU]/mL (ref 0.35–5.50)

## 2012-04-18 LAB — BASIC METABOLIC PANEL
CO2: 29 mEq/L (ref 19–32)
Chloride: 105 mEq/L (ref 96–112)
Glucose, Bld: 69 mg/dL — ABNORMAL LOW (ref 70–99)
Sodium: 141 mEq/L (ref 135–145)

## 2012-04-18 NOTE — Patient Instructions (Addendum)
Your physician recommends that you schedule a follow-up appointment in: 4-6 WEEKS WITH DR. Ladona Ridgel IF NOT AVAILABLE THEN OK PER SCOTT TO GO OUT A LITTLE FURTHER 6-8 WEEKS  Your physician recommends that you return for lab work in: TODAY BMET, MAGNESIUM LEVEL, TSH  Your physician has recommended that you wear an event monitor DX 786.50, 785.1. Event monitors are medical devices that record the heart's electrical activity. Doctors most often [place] these monitors to diagnose arrhythmias. Arrhythmias are problems with the speed or rhythm of the heartbeat. The monitor is a small, portable device. You can wear one while you do your normal daily activities. This is usually used to diagnose what is causing palpitations/syncope (passing out).

## 2012-04-18 NOTE — Progress Notes (Signed)
[address] [place], [place]  [address] Phone: [phone] Fax:  [phone]  Date:  04/18/2012   Name:  Albert Robles   DOB:  Jan 11, 1976   MRN:  [id number]  PCP:  Provider Not In System  Primary Cardiologist/Primary Electrophysiologist:  Dr. Lewayne Bunting    History of Present Illness: Albert Robles is a 36 y.o. male who returns for post ED follow up.  He has a history of SVT in the setting of Wolff-Parkinson-White syndrome.  He initially presented in 09/2011 with chest pain.  He was noted to be in SVT and converted to sinus rhythm with vagal maneuvers.  Echocardiogram demonstrated EF 60-65%.  He ultimately underwent cardiac catheterization 12/12 that demonstrated normal coronary arteries.  He was seen by Dr. Ladona Ridgel and placed on a trial of beta blockers.  The patient had recurrent palpitations and presented back to the hospital on 10/2011 with recurrent SVT.  He underwent radiofrequency catheter ablation by Dr. Ladona Ridgel for his SVT which was successful.  He presented to the emergency room 04/08/12 with chest pain.  Cardiac markers were normal.  Chest x-ray was normal.  EKG was reviewed by me. No acute changes noted and no evidence of preexcitation.  States his symptoms that sent him to the ED were exactly like his prior symptoms with his SVT.  Has felt fine since.  Denies any further palpitations, chest pain, syncope, orthopnea, PND, edema.  He did not tolerate beta blockers in the past.    Wt Readings from Last 3 Encounters:  04/18/12 161 lb 12.8 oz (73.392 kg)  04/07/12 160 lb (72.576 kg)  11/08/11 162 lb 4 oz (73.596 kg)     Potassium  Date/Time Value Range Status  04/07/2012  4:00 PM 3.3* 3.5 - 5.1 mEq/L Final     Creatinine, Ser  Date/Time Value Range Status  04/07/2012  4:00 PM 1.15  0.50 - 1.35 mg/dL Final     ALT  Date/Time Value Range Status  10/04/2011 12:40 AM 11  0 - 53 U/L Final     TSH  Date/Time Value Range Status  10/04/2011 11:38 PM 1.687   0.350 - 4.500 uIU/mL Final     Hemoglobin  Date/Time Value Range Status  04/07/2012  4:00 PM 14.6  13.0 - 17.0 g/dL Final    Past Medical History  Diagnosis Date  . Tobacco abuse     20 pack yr history - currently smoking 1ppd  . Chest pain     in setting of SVT, NL Cath 10/04/2011  . Wolf-Parkinson-White syndrome     Diagnosed 10/04/2011; NL LV fxn by echo 10/04/2011  . SVT (supraventricular tachycardia)     10/04/2011 - broke w vagal maneuvers    No current outpatient prescriptions on file.    Allergies: No Known Allergies  History  Substance Use Topics  . Smoking status: Current Everyday Smoker -- 1.0 packs/day for 20 years    Types: Cigarettes  . Smokeless tobacco: Never Used  . Alcohol Use: No     ROS:  Please see the history of present illness.   All other systems reviewed and negative.   PHYSICAL EXAM: VS:  BP 110/68  Pulse 68  Ht 5\' 11"  (1.803 m)  Wt 161 lb 12.8 oz (73.392 kg)  BMI 22.57 kg/m2 Well nourished, well developed, in no acute distress HEENT: normal Neck: no JVD Vascular: no carotid bruits Endocrine: no thyromegaly Cardiac:  normal S1, S2; RRR; no murmur Lungs:  clear to  auscultation bilaterally, no wheezing, rhonchi or rales Abd: soft, nontender, no hepatomegaly Ext: no edema Skin: warm and dry Neuro:  CNs 2-12 intact, no focal abnormalities noted  EKG:  Sinus rhythm, heart rate 68, normal axis, no acute change   ASSESSMENT AND PLAN:  1.  Palpitations Symptoms reminiscent of prior SVT.  First occurrence since ablation. I will put him on an event monitor and check a BMET (to f/u on K+), Mg2+ and TSH. Follow up with Dr. Lewayne Bunting in 6 weeks.  2.  Supraventricular Tachycardia in the setting of Wolf-Parkinson-White No evidence of delta waves on current ECG. Continue with workup as noted above.   [name], PA-C  11:02 AM 04/18/2012

## 2012-04-20 ENCOUNTER — Encounter: Payer: Self-pay | Admitting: Physician Assistant

## 2012-05-29 ENCOUNTER — Telehealth: Payer: Self-pay | Admitting: *Deleted

## 2012-05-29 NOTE — Telephone Encounter (Signed)
Message copied by Deliah Boston on Thu May 29, 2012 10:39 AM ------      Message from: Connye Burkitt      Created: Wed May 07, 2012  4:41 PM       Patient canceled monitor appt 05/06/12

## 2012-06-09 ENCOUNTER — Ambulatory Visit: Payer: Self-pay | Admitting: Internal Medicine

## 2012-06-11 ENCOUNTER — Encounter: Payer: Self-pay | Admitting: Internal Medicine

## 2012-06-11 ENCOUNTER — Ambulatory Visit (INDEPENDENT_AMBULATORY_CARE_PROVIDER_SITE_OTHER): Payer: Self-pay | Admitting: Internal Medicine

## 2012-06-11 VITALS — BP 100/62 | HR 67 | Resp 18 | Ht 71.0 in | Wt 158.8 lb

## 2012-06-11 DIAGNOSIS — R0789 Other chest pain: Secondary | ICD-10-CM

## 2012-06-11 DIAGNOSIS — I456 Pre-excitation syndrome: Secondary | ICD-10-CM

## 2012-06-11 NOTE — Progress Notes (Signed)
HPI Albert Robles returns today for followup. He is a 36 year old man was WPW syndrome, SVT, who underwent catheter ablation several months ago. At that time he had left-sided accessory pathway which was successfully ablated. Since then he has had no recurrent tachypalpitations. He does note intermittent, nonexertional chest discomfort. He wonders if this is related to his SVT because when he had SVT, he would have chest discomfort. No syncope. He still smokes cigarettes approximately one pack per day. No Known Allergies   No current outpatient prescriptions on file.     Past Medical History  Diagnosis Date  . Tobacco abuse     20 pack yr history - currently smoking 1ppd  . Chest pain     in setting of SVT, NL Cath 10/04/2011  . Wolf-Parkinson-White syndrome     Diagnosed 10/04/2011; NL LV fxn by echo 10/04/2011  . SVT (supraventricular tachycardia)     10/04/2011 - broke w vagal maneuvers    ROS:   All systems reviewed and negative except as noted in the HPI.   Past Surgical History  Procedure Date  . Cardiac electrophysiology mapping and ablation 11/05/11  . Cardiac catheterization 10/04/11  . Cardiac catheterization 11/05/11     No family history on file.   History   Social History  . Marital Status: Single    Spouse Name: N/A    Number of Children: N/A  . 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 Smoking status: Current Everyday Smoker -- 1.0 packs/day for 20 years    Types: Cigarettes  . Smokeless tobacco: Never Used  . Alcohol Use: No  . Drug Use: No  . Sexually Active: Yes   Other Topics Concern  . Not on file   Social History Narrative   Lives with long-term girlfriend and young child     BP 100/62  Pulse 67  Resp 18  Ht 5\' 11"  (1.803 m)  Wt 158 lb 12.8 oz (72.031 kg)  BMI 22.15 kg/m2  SpO2 94%  Physical Exam:  Well appearing 36 year old man, NAD HEENT: Unremarkable Neck:   No JVD, no thyromegally Lungs:  Clear with no wheezes, rales, or rhonchi. HEART:  Regular rate rhythm, no murmurs, no rubs, no clicks Abd:  soft, positive bowel sounds, no organomegally, no rebound, no guarding Ext:  2 plus pulses, no edema, no cyanosis, no clubbing Skin:  No rashes no nodules Neuro:  CN II through XII intact, motor grossly intact  EKG Normal sinus rhythm with no evidence of ventricular preexcitation.  Assess/Plan:

## 2012-06-11 NOTE — Assessment & Plan Note (Signed)
The etiology of his chest pressure is unclear. It is nonexertional and does not appear to be due to SVT. I've recommended watchful waiting and stopping cigarette smoking.

## 2012-06-11 NOTE — Patient Instructions (Signed)
Your physician recommends that you schedule a follow-up appointment as needed  

## 2012-06-11 NOTE — Assessment & Plan Note (Signed)
He has no evidence of recurrent Wolff-Parkinson-White syndrome. His ECG demonstrates no preexcitation. He has had no SVT. I've recommended watchful waiting.

## 2013-05-18 ENCOUNTER — Emergency Department (INDEPENDENT_AMBULATORY_CARE_PROVIDER_SITE_OTHER)
Admission: EM | Admit: 2013-05-18 | Discharge: 2013-05-18 | Disposition: A | Discharge status: Home / Self Care | Payer: Self-pay | Source: Home / Self Care | Attending: Family Medicine | Admitting: Family Medicine

## 2013-05-18 ENCOUNTER — Encounter (HOSPITAL_COMMUNITY): Payer: Self-pay | Admitting: Emergency Medicine

## 2013-05-18 DIAGNOSIS — M549 Dorsalgia, unspecified: Secondary | ICD-10-CM

## 2013-05-18 MED ORDER — METHYLPREDNISOLONE ACETATE 80 MG/ML IJ SUSP
INTRAMUSCULAR | Status: AC
  Filled 2013-05-18: qty 1

## 2013-05-18 MED ORDER — HYDROCODONE-ACETAMINOPHEN 7.5-325 MG PO TABS: 1.0000 | ORAL_TABLET | Freq: Four times a day (QID) | ORAL | Status: DC | PRN

## 2013-05-18 MED ORDER — KETOROLAC TROMETHAMINE 60 MG/2ML IM SOLN
INTRAMUSCULAR | Status: AC
  Filled 2013-05-18: qty 2

## 2013-05-18 MED ORDER — METHYLPREDNISOLONE ACETATE 40 MG/ML IJ SUSP
80.0000 mg | Freq: Once | INTRAMUSCULAR | Status: AC
  Administered 2013-05-18: 80 mg via INTRAMUSCULAR

## 2013-05-18 MED ORDER — CYCLOBENZAPRINE HCL 10 MG PO TABS: 10.0000 mg | ORAL_TABLET | Freq: Three times a day (TID) | ORAL | Status: DC | PRN

## 2013-05-18 MED ORDER — KETOROLAC TROMETHAMINE 60 MG/2ML IM SOLN
60.0000 mg | Freq: Once | INTRAMUSCULAR | Status: AC
  Administered 2013-05-18: 60 mg via INTRAMUSCULAR

## 2013-05-18 MED ORDER — IBUPROFEN 800 MG PO TABS: 800.0000 mg | ORAL_TABLET | Freq: Three times a day (TID) | ORAL | Status: DC

## 2013-05-18 NOTE — ED Notes (Signed)
Complaining of back pain since yesterday.  Denies injury to back.  Has tried ibuprofen and heat and ice but no relief.

## 2013-05-18 NOTE — ED Provider Notes (Signed)
History    CSN: [id number] Arrival date & time 05/18/13  1309  First MD Initiated Contact with Patient 05/18/13 1351     Chief Complaint  Patient presents with  . Back Pain   (Consider location/radiation/quality/duration/timing/severity/associated sxs/prior Treatment) HPI Comments: 40m presents c/o lower back pain since yesterday.  The day before, he was leaning over lifting up a washing machine.  At the time, he did not feel any pain or have any specific injury.  The next day (yesterday) he work up with lower back pain bilaterally but somewhat localized to the right side.  The pain radiates down both hamstrings to the knees and occasionally all the way to the right ankle.  He has had some mild back pain in the past but never anything this severe.  Denies fever, chills, hematuris, dysuria, groin pain, or change in bowel/bladder control or function.  He has been using ice, heat, ibuprofen, and tylenol without much relief/    Past Medical History  Diagnosis Date  . Tobacco abuse     20 pack yr history - currently smoking 1ppd  . Chest pain     in setting of SVT, NL Cath 10/04/2011  . Wolf-Parkinson-White syndrome     Diagnosed 10/04/2011; NL LV fxn by echo 10/04/2011  . SVT (supraventricular tachycardia)     10/04/2011 - broke w vagal maneuvers   Past Surgical History  Procedure Laterality Date  . Cardiac electrophysiology mapping and ablation  11/05/11  . Cardiac catheterization  10/04/11  . Cardiac catheterization  11/05/11   History reviewed. No pertinent family history. History  Substance Use Topics  . Smoking status: Current Every Day Smoker -- 1.00 packs/day for 20 years    Types: Cigarettes  . Smokeless tobacco: Never Used  . Alcohol Use: No    Review of Systems  Constitutional: Negative for fever, chills and fatigue.  HENT: Negative for sore throat, neck pain and neck stiffness.   Eyes: Negative for visual disturbance.  Respiratory: Negative for cough and shortness of  breath.   Cardiovascular: Negative for chest pain, palpitations and leg swelling.  Gastrointestinal: Negative for nausea, vomiting, abdominal pain, diarrhea and constipation.  Genitourinary: Negative for dysuria, urgency, frequency and hematuria.  Musculoskeletal: Positive for back pain. Negative for myalgias and arthralgias.  Skin: Negative for rash.  Neurological: Negative for dizziness, weakness and light-headedness.    Allergies  Review of patient's allergies indicates no known allergies.  Home Medications   Current Outpatient Rx  Name  Route  Sig  Dispense  Refill  . cyclobenzaprine (FLEXERIL) 10 MG tablet   Oral   Take 1 tablet (10 mg total) by mouth 3 (three) times daily as needed for muscle spasms.   30 tablet   0   . HYDROcodone-acetaminophen (NORCO) 7.5-325 MG per tablet   Oral   Take 1 tablet by mouth every 6 (six) hours as needed for pain.   20 tablet   0   . ibuprofen (ADVIL,MOTRIN) 800 MG tablet   Oral   Take 1 tablet (800 mg total) by mouth 3 (three) times daily.   30 tablet   1    BP 126/73  Pulse 82  Temp(Src) 98.3 F (36.8 C) (Oral)  Resp 18  SpO2 99% Physical Exam  Nursing note and vitals reviewed. Constitutional: He is oriented to person, place, and time. He appears well-developed and well-nourished. No distress.  HENT:  Head: Normocephalic and atraumatic.  Eyes: EOM are normal. Pupils are equal, round, and  reactive to light.  Cardiovascular: Normal rate and regular rhythm.  Exam reveals no gallop and no friction rub.   No murmur heard. Pulmonary/Chest: Effort normal and breath sounds normal. No respiratory distress. He has no wheezes. He has no rales.  Abdominal: Soft. There is no tenderness.  Musculoskeletal:       Lumbar back: He exhibits decreased range of motion and tenderness (right SI joint only ). He exhibits no bony tenderness, no swelling, no edema and no spasm.  Neurological: He is oriented to person, place, and time.  Skin: Skin is  warm and dry. No rash noted.  Psychiatric: He has a normal mood and affect. Judgment normal.    ED Course  Procedures (including critical care time) Labs Reviewed - No data to display No results found. 1. Back pain     MDM  Treat with NSAIDs, muscle relaxers, short course of pain meds.  Stay active.  F/u PRN    Meds ordered this encounter  Medications  . ketorolac (TORADOL) injection 60 mg    Sig:   . methylPREDNISolone acetate (DEPO-MEDROL) injection 80 mg    Sig:   . ibuprofen (ADVIL,MOTRIN) 800 MG tablet    Sig: Take 1 tablet (800 mg total) by mouth 3 (three) times daily.    Dispense:  30 tablet    Refill:  1  . cyclobenzaprine (FLEXERIL) 10 MG tablet    Sig: Take 1 tablet (10 mg total) by mouth 3 (three) times daily as needed for muscle spasms.    Dispense:  30 tablet    Refill:  0  . HYDROcodone-acetaminophen (NORCO) 7.5-325 MG per tablet    Sig: Take 1 tablet by mouth every 6 (six) hours as needed for pain.    Dispense:  20 tablet    Refill:  0     Graylon Good, PA-C 05/18/13 1502

## 2013-05-18 NOTE — ED Provider Notes (Signed)
Medical screening examination/treatment/procedure(s) were performed by resident physician or non-physician practitioner and as supervising physician I was immediately available for consultation/collaboration.   Nichollas Perusse DOUGLAS MD.   Jacobs Golab D Aleyna Cueva, MD 05/18/13 2026 

## 2013-08-13 ENCOUNTER — Encounter (HOSPITAL_COMMUNITY): Payer: Self-pay | Admitting: Emergency Medicine

## 2013-08-13 ENCOUNTER — Emergency Department (HOSPITAL_COMMUNITY)
Admission: EM | Admit: 2013-08-13 | Discharge: 2013-08-13 | Disposition: A | Discharge status: Home / Self Care | Payer: Self-pay | Attending: Emergency Medicine | Admitting: Emergency Medicine

## 2013-08-13 DIAGNOSIS — Z792 Long term (current) use of antibiotics: Secondary | ICD-10-CM | POA: Insufficient documentation

## 2013-08-13 DIAGNOSIS — L02419 Cutaneous abscess of limb, unspecified: Secondary | ICD-10-CM | POA: Insufficient documentation

## 2013-08-13 DIAGNOSIS — Z79899 Other long term (current) drug therapy: Secondary | ICD-10-CM | POA: Insufficient documentation

## 2013-08-13 DIAGNOSIS — F17200 Nicotine dependence, unspecified, uncomplicated: Secondary | ICD-10-CM | POA: Insufficient documentation

## 2013-08-13 DIAGNOSIS — L0291 Cutaneous abscess, unspecified: Secondary | ICD-10-CM

## 2013-08-13 DIAGNOSIS — Z8679 Personal history of other diseases of the circulatory system: Secondary | ICD-10-CM | POA: Insufficient documentation

## 2013-08-13 MED ORDER — CEPHALEXIN 500 MG PO CAPS: 500.0000 mg | ORAL_CAPSULE | Freq: Four times a day (QID) | ORAL | Status: DC

## 2013-08-13 NOTE — ED Provider Notes (Signed)
CSN: [id number]     Arrival date & time 08/13/13  1555 History   First MD Initiated Contact with Patient 08/13/13 1607     Chief Complaint  Patient presents with  . Cellulitis   (Consider location/radiation/quality/duration/timing/severity/associated sxs/prior Treatment) Patient is a 37 y.o. male presenting with abscess. The history is provided by the patient.  Abscess Location:  Leg Leg abscess location:  L upper leg Size:  3 cm Abscess quality: draining, induration, painful, redness and warmth   Duration:  5 days Progression:  Worsening Pain details:    Severity:  Severe   Timing:  Constant   Progression:  Worsening Chronicity:  New Relieved by:  Nothing Worsened by:  Draining/squeezing Ineffective treatments:  Oral antibiotics Associated symptoms: no fatigue, no fever, no headaches, no nausea and no vomiting   Risk factors: no hx of MRSA and no prior abscess     Past Medical History  Diagnosis Date  . Tobacco abuse     20 pack yr history - currently smoking 1ppd  . Chest pain     in setting of SVT, NL Cath 10/04/2011  . Wolf-Parkinson-White syndrome     Diagnosed 10/04/2011; NL LV fxn by echo 10/04/2011  . SVT (supraventricular tachycardia)     10/04/2011 - broke w vagal maneuvers   Past Surgical History  Procedure Laterality Date  . Cardiac electrophysiology mapping and ablation  11/05/11  . Cardiac catheterization  10/04/11  . Cardiac catheterization  11/05/11   History reviewed. No pertinent family history. History  Substance Use Topics  . Smoking status: Current Every Day Smoker -- 1.00 packs/day for 20 years    Types: Cigarettes  . Smokeless tobacco: Never Used  . Alcohol Use: No    Review of Systems  Constitutional: Negative for fever and fatigue.  Respiratory: Negative for cough and shortness of breath.   Cardiovascular: Negative for chest pain.  Gastrointestinal: Negative for nausea, vomiting, diarrhea and constipation.  Skin: Positive for color change,  rash and wound. Negative for pallor.  Neurological: Negative for headaches.  All other systems reviewed and are negative.    Allergies  Review of patient's allergies indicates no known allergies.  Home Medications   Current Outpatient Rx  Name  Route  Sig  Dispense  Refill  . sulfamethoxazole-trimethoprim (BACTRIM DS) 800-160 MG per tablet   Oral   Take 1 tablet by mouth 2 (two) times daily. X 10 days         . cephALEXin (KEFLEX) 500 MG capsule   Oral   Take 1 capsule (500 mg total) by mouth 4 (four) times daily.   28 capsule   0    BP 108/72  Pulse 91  Temp(Src) 98 F (36.7 C) (Oral)  Resp 18  Wt 157 lb 14.4 oz (71.623 kg)  BMI 22.03 kg/m2  SpO2 99% Physical Exam  Nursing note and vitals reviewed. Constitutional: He is oriented to person, place, and time. He appears well-developed and well-nourished. No distress.  HENT:  Head: Normocephalic and atraumatic.  Mouth/Throat: Oropharynx is clear and moist. No oropharyngeal exudate.  Eyes: Conjunctivae and EOM are normal. Pupils are equal, round, and reactive to light.  Neck: Normal range of motion. Neck supple.  Cardiovascular: Normal rate, regular rhythm, normal heart sounds and intact distal pulses.  Exam reveals no gallop and no friction rub.   No murmur heard. Pulmonary/Chest: Effort normal and breath sounds normal. No respiratory distress. He has no wheezes. He has no rales.  Abdominal: Soft.  He exhibits no distension and no mass. There is no tenderness. There is no rebound and no guarding.  Musculoskeletal: Normal range of motion. He exhibits no edema and no tenderness.  Lymphadenopathy:    He has no cervical adenopathy.  Neurological: He is alert and oriented to person, place, and time. No cranial nerve deficit.  Skin: Skin is warm and dry. Rash noted. He is not diaphoretic. There is erythema.  4 cm circular area of erythema and induration. Central scab in place. No drainage noted. Extremely tender.   Psychiatric: He has a normal mood and affect. His behavior is normal. Judgment and thought content normal.    ED Course  INCISION AND DRAINAGE Date/Time: 08/13/2013 5:17 PM Performed by: Dorna Leitz Authorized by: Dorna Leitz Consent: Verbal consent obtained. Risks and benefits: risks, benefits and alternatives were discussed Consent given by: patient Required items: required blood products, implants, devices, and special equipment available Patient identity confirmed: verbally with patient Time out: Immediately prior to procedure a "time out" was called to verify the correct patient, procedure, equipment, support staff and site/side marked as required. Type: abscess Body area: lower extremity Location details: left leg Anesthesia: local infiltration Local anesthetic: lidocaine 1% with epinephrine Anesthetic total: 3 ml Patient sedated: no Scalpel size: 11 Incision type: single straight Complexity: simple Drainage: purulent Drainage amount: moderate Wound treatment: wound left open Packing material: none Patient tolerance: Patient tolerated the procedure well with no immediate complications.   (including critical care time) Labs Review Labs Reviewed - No data to display Imaging Review No results found.  EKG Interpretation   None       MDM   1. Abscess and cellulitis     37 year old male with no significant past medical history presents with abscess and cellulitis to his left leg. Started on Saturday as a blister which then ruptured. Saw his PCP yesterday for worsening erythema and pain, and was started on Bactrim. Has noted serous sanguinous drainage since yesterday. Afebrile and otherwise well. Is not remember any bites or exposures. Has never had any symptoms like this previously.  Bedside ultrasound shows likely abscess underlying cellulitis. Central scab noted. No fevers or signs of tick borne illness. Will I&D abscess and d/c home on continued  bactrim.  Abscess drained as above. Continue bactrim and add keflex for strep coverage. D/c with PCP follow up.  Discussed with the patient return precautions and need for follow up with PCP. Patient voiced understanding. Stable for d/c. This patient was discussed with my attending, Dr. Wilkie Aye.    Dorna Leitz, MD 08/13/13 [phone]

## 2013-08-13 NOTE — ED Notes (Signed)
Presents with redness, induration and warmth to left posterior thigh, began as small blister on Saturday and has progressed to redness, warmth and induration size of grapefruit. Began taking Sulfa yesterday and site is worsening. Denies fevers. Drainage seroussanguinous.

## 2013-08-14 NOTE — ED Provider Notes (Signed)
I saw and evaluated the patient, reviewed the resident's note and I agree with the findings and plan.  Patient presents with abscess and cellulitis to the left thigh.  No systemic symptoms.  Placed on bactrim by PCP yesterday.  INcreased pain and redness noted.  Physical exam notable for small abscess with associated errythema consistent with cellulitis.  I/D'd by resident.  Keflex added given increased area of errythema on bactrim.  After history, exam, and medical workup I feel the patient has been appropriately medically screened and is safe for discharge home. Pertinent diagnoses were discussed with the patient. Patient was given return precautions.   Shon Baton, MD 08/14/13 [phone]

## 2013-09-18 ENCOUNTER — Encounter (HOSPITAL_BASED_OUTPATIENT_CLINIC_OR_DEPARTMENT_OTHER): Payer: Self-pay | Admitting: Emergency Medicine

## 2013-09-18 ENCOUNTER — Emergency Department (HOSPITAL_BASED_OUTPATIENT_CLINIC_OR_DEPARTMENT_OTHER)
Admission: EM | Admit: 2013-09-18 | Discharge: 2013-09-18 | Disposition: A | Discharge status: Home / Self Care | Payer: Self-pay | Attending: Emergency Medicine | Admitting: Emergency Medicine

## 2013-09-18 DIAGNOSIS — Z8679 Personal history of other diseases of the circulatory system: Secondary | ICD-10-CM | POA: Insufficient documentation

## 2013-09-18 DIAGNOSIS — M545 Low back pain, unspecified: Secondary | ICD-10-CM | POA: Insufficient documentation

## 2013-09-18 DIAGNOSIS — M549 Dorsalgia, unspecified: Secondary | ICD-10-CM

## 2013-09-18 DIAGNOSIS — F17200 Nicotine dependence, unspecified, uncomplicated: Secondary | ICD-10-CM | POA: Insufficient documentation

## 2013-09-18 MED ORDER — OXYCODONE-ACETAMINOPHEN 5-325 MG PO TABS: 1.0000 | ORAL_TABLET | ORAL | Status: AC | PRN

## 2013-09-18 MED ORDER — DIAZEPAM 5 MG PO TABS
5.0000 mg | ORAL_TABLET | Freq: Once | ORAL | Status: AC
  Administered 2013-09-18: 5 mg via ORAL
  Filled 2013-09-18: qty 1

## 2013-09-18 MED ORDER — IBUPROFEN 800 MG PO TABS
800.0000 mg | ORAL_TABLET | Freq: Once | ORAL | Status: AC
  Administered 2013-09-18: 800 mg via ORAL
  Filled 2013-09-18: qty 1

## 2013-09-18 MED ORDER — DIAZEPAM 5 MG PO TABS: 5.0000 mg | ORAL_TABLET | Freq: Three times a day (TID) | ORAL | Status: AC | PRN

## 2013-09-18 MED ORDER — METHYLPREDNISOLONE 4 MG PO KIT: PACK | ORAL | Status: AC

## 2013-09-18 MED ORDER — IBUPROFEN 800 MG PO TABS: 800.0000 mg | ORAL_TABLET | Freq: Three times a day (TID) | ORAL | Status: DC

## 2013-09-18 NOTE — ED Notes (Addendum)
Per EMS:  Pt fell while getting out of his truck.  Pt c/o lower back pain down both legs.  Some numbness in legs.  Pt has IV in place, Fentanyl 200 mcg given per EMS.

## 2013-09-18 NOTE — ED Provider Notes (Signed)
TIME SEEN: 10:55 AM  CHIEF COMPLAINT: Back pain  HPI: Patient is a 37 year old may all with a history of Wolff-Parkinson-White who presents the emergency department with complaints of left lower back pain. He states he has had similar pain in the past.  He states his pain started yesterday in. Denies any known injury. He describes as a throbbing it radiates down his left leg. He does have some numbness in his left lateral foot. Denies any weakness. No bowel or bladder incontinence. No urinary retention. No fever. He reports that today when going to work he stepped on a large rock at his house causing him to lean towards the left side which exacerbated his pain. He denies adamantly that he fell. Denies any other injury. Denies saddle anesthesia or numbness in the right leg.  ROS: See HPI Constitutional: no fever  Eyes: no drainage  ENT: no runny nose   Cardiovascular:  no chest pain  Resp: no SOB  GI: no vomiting GU: no dysuria Integumentary: no rash  Allergy: no hives  Musculoskeletal: no leg swelling  Neurological: no slurred speech ROS otherwise negative  PAST MEDICAL HISTORY/PAST SURGICAL HISTORY:  Past Medical History  Diagnosis Date  . Tobacco abuse     20 pack yr history - currently smoking 1ppd  . Chest pain     in setting of SVT, NL Cath 10/04/2011  . Wolf-Parkinson-White syndrome     Diagnosed 10/04/2011; NL LV fxn by echo 10/04/2011  . SVT (supraventricular tachycardia)     10/04/2011 - broke w vagal maneuvers    MEDICATIONS:  Prior to Admission medications   Not on File    ALLERGIES:  No Known Allergies  SOCIAL HISTORY:  History  Substance Use Topics  . Smoking status: Current Every Day Smoker -- 1.00 packs/day for 20 years    Types: Cigarettes  . Smokeless tobacco: Never Used  . Alcohol Use: No    FAMILY HISTORY: No family history on file.  EXAM: BP 128/78  Pulse 97  Temp(Src) 98.6 F (37 C) (Oral)  Resp 16  Ht 5\' 11"  (1.803 m)  Wt 165 lb (74.844  kg)  BMI 23.02 kg/m2  SpO2 98% CONSTITUTIONAL: Alert and oriented and responds appropriately to questions. Well-appearing; well-nourished HEAD: Normocephalic EYES: Conjunctivae clear, PERRL ENT: normal nose; no rhinorrhea; moist mucous membranes; pharynx without lesions noted NECK: Supple, no meningismus, no LAD  CARD: RRR; S1 and S2 appreciated; no murmurs, no clicks, no rubs, no gallops RESP: Normal chest excursion without splinting or tachypnea; breath sounds clear and equal bilaterally; no wheezes, no rhonchi, no rales,  ABD/GI: Normal bowel sounds; non-distended; soft, non-tender, no rebound, no guarding BACK:  The back appears normal and is mildly tender to palpation over the left side of his back with no midline step-off or deformity, no CVA tenderness EXT: Normal ROM in all joints; non-tender to palpation; no edema; normal capillary refill; no cyanosis    SKIN: Normal color for age and race; warm NEURO: Moves all extremities equally; normal gait, strength 5/5 in all 4 extremities, slightly decreased sensation to his left lateral foot, 2+ DP pulses bilaterally, 2+ bilateral patellar and Achilles reflexes bilaterally, no saddle anesthesia PSYCH: The patient's mood and manner are appropriate. Grooming and personal hygiene are appropriate.  MEDICAL DECISION MAKING: Patient here with back pain for the past 2 days of similar to his prior back pain. He is having some numbness in his left lateral foot but no other neurologic deficit.  No weakness,  urinary retention, bowel or bladder incontinence. No fever. I am not concerned for cauda equina, discitis, transverse myelitis, osteomyelitis, epidural abscess. No concern for life-threatening illness. I do not feel he needs imaging at this time as he is no significant traumatic injury. Will discharge her with pain medication, muscle relaxers, anti-inflammatories, steroids. We'll give return precautions. Patient reports he does have a primary care  physician for followup. Patient verbalized understanding and is comfortable with plan.      Layla Maw Ward, DO 09/18/13 1113

## 2014-09-21 ENCOUNTER — Emergency Department (HOSPITAL_COMMUNITY)
Admission: EM | Admit: 2014-09-21 | Discharge: 2014-09-22 | Disposition: A | Discharge status: Home / Self Care | Payer: Self-pay | Attending: Emergency Medicine | Admitting: Emergency Medicine

## 2014-09-21 ENCOUNTER — Encounter (HOSPITAL_COMMUNITY): Payer: Self-pay | Admitting: Emergency Medicine

## 2014-09-21 ENCOUNTER — Emergency Department (HOSPITAL_COMMUNITY): Payer: Self-pay

## 2014-09-21 DIAGNOSIS — S61216A Laceration without foreign body of right little finger without damage to nail, initial encounter: Secondary | ICD-10-CM | POA: Insufficient documentation

## 2014-09-21 DIAGNOSIS — Y998 Other external cause status: Secondary | ICD-10-CM | POA: Insufficient documentation

## 2014-09-21 DIAGNOSIS — Y9389 Activity, other specified: Secondary | ICD-10-CM | POA: Insufficient documentation

## 2014-09-21 DIAGNOSIS — S61219A Laceration without foreign body of unspecified finger without damage to nail, initial encounter: Secondary | ICD-10-CM

## 2014-09-21 DIAGNOSIS — S61222A Laceration with foreign body of right middle finger without damage to nail, initial encounter: Secondary | ICD-10-CM | POA: Insufficient documentation

## 2014-09-21 DIAGNOSIS — S61224A Laceration with foreign body of right ring finger without damage to nail, initial encounter: Secondary | ICD-10-CM | POA: Insufficient documentation

## 2014-09-21 DIAGNOSIS — W228XXA Striking against or struck by other objects, initial encounter: Secondary | ICD-10-CM | POA: Insufficient documentation

## 2014-09-21 DIAGNOSIS — Z8679 Personal history of other diseases of the circulatory system: Secondary | ICD-10-CM | POA: Insufficient documentation

## 2014-09-21 DIAGNOSIS — Y92410 Unspecified street and highway as the place of occurrence of the external cause: Secondary | ICD-10-CM | POA: Insufficient documentation

## 2014-09-21 DIAGNOSIS — Z720 Tobacco use: Secondary | ICD-10-CM | POA: Insufficient documentation

## 2014-09-21 DIAGNOSIS — Z23 Encounter for immunization: Secondary | ICD-10-CM | POA: Insufficient documentation

## 2014-09-21 DIAGNOSIS — Z9889 Other specified postprocedural states: Secondary | ICD-10-CM | POA: Insufficient documentation

## 2014-09-21 MED ORDER — TETANUS-DIPHTH-ACELL PERTUSSIS 5-2.5-18.5 LF-MCG/0.5 IM SUSP
0.5000 mL | Freq: Once | INTRAMUSCULAR | Status: AC
  Administered 2014-09-21: 0.5 mL via INTRAMUSCULAR
  Filled 2014-09-21: qty 0.5

## 2014-09-21 MED ORDER — IBUPROFEN 400 MG PO TABS
800.0000 mg | ORAL_TABLET | Freq: Once | ORAL | Status: AC
  Administered 2014-09-21: 800 mg via ORAL
  Filled 2014-09-21: qty 2
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
## 2014-09-22 MED ORDER — LIDOCAINE HCL (PF) 1 % IJ SOLN
5.0000 mL | Freq: Once | INTRAMUSCULAR | Status: AC
  Administered 2014-09-22: 5 mL via INTRADERMAL
  Filled 2014-09-22: qty 5

## 2014-09-22 NOTE — Discharge Instructions (Signed)
Facial Laceration ° A facial laceration is a cut on the face. These injuries can be painful and cause bleeding. Lacerations usually heal quickly, but they need special care to reduce scarring. °DIAGNOSIS  °Your health care provider will take a medical history, ask for details about how the injury occurred, and examine the wound to determine how deep the cut is. °TREATMENT  °Some facial lacerations may not require closure. Others may not be able to be closed because of an increased risk of infection. The risk of infection and the chance for successful closure will depend on various factors, including the amount of time since the injury occurred. °The wound may be cleaned to help prevent infection. If closure is appropriate, pain medicines may be given if needed. Your health care provider will use stitches (sutures), wound glue (adhesive), or skin adhesive strips to repair the laceration. These tools bring the skin edges together to allow for faster healing and a better cosmetic outcome. If needed, you may also be given a tetanus shot. °HOME CARE INSTRUCTIONS °· Only take over-the-counter or prescription medicines as directed by your health care provider. °· Follow your health care provider's instructions for wound care. These instructions will vary depending on the technique used for closing the wound. °For Sutures: °· Keep the wound clean and dry.   °· If you were given a bandage (dressing), you should change it at least once a day. Also change the dressing if it becomes wet or dirty, or as directed by your health care provider.   °· Wash the wound with soap and water 2 times a day. Rinse the wound off with water to remove all soap. Pat the wound dry with a clean towel.   °· After cleaning, apply a thin layer of the antibiotic ointment recommended by your health care provider. This will help prevent infection and keep the dressing from sticking.   °· You may shower as usual after the first 24 hours. Do not soak the  wound in water until the sutures are removed.   °· Get your sutures removed as directed by your health care provider. With facial lacerations, sutures should usually be taken out after 4-5 days to avoid stitch marks.   °· Wait a few days after your sutures are removed before applying any makeup. °For Skin Adhesive Strips: °· Keep the wound clean and dry.   °· Do not get the skin adhesive strips wet. You may bathe carefully, using caution to keep the wound dry.   °· If the wound gets wet, pat it dry with a clean towel.   °· Skin adhesive strips will fall off on their own. You may trim the strips as the wound heals. Do not remove skin adhesive strips that are still stuck to the wound. They will fall off in time.   °For Wound Adhesive: °· You may briefly wet your wound in the shower or bath. Do not soak or scrub the wound. Do not swim. Avoid periods of heavy sweating until the skin adhesive has fallen off on its own. After showering or bathing, gently pat the wound dry with a clean towel.   °· Do not apply liquid medicine, cream medicine, ointment medicine, or makeup to your wound while the skin adhesive is in place. This may loosen the film before your wound is healed.   °· If a dressing is placed over the wound, be careful not to apply tape directly over the skin adhesive. This may cause the adhesive to be pulled off before the wound is healed.   °· Avoid   prolonged exposure to sunlight or tanning lamps while the skin adhesive is in place. °· The skin adhesive will usually remain in place for 5-10 days, then naturally fall off the skin. Do not pick at the adhesive film.   °After Healing: °Once the wound has healed, cover the wound with sunscreen during the day for 1 full year. This can help minimize scarring. Exposure to ultraviolet light in the first year will darken the scar. It can take 1-2 years for the scar to lose its redness and to heal completely.  °SEEK IMMEDIATE MEDICAL CARE IF: °· You have redness, pain, or  swelling around the wound.   °· You see a yellowish-white fluid (pus) coming from the wound.   °· You have chills or a fever.   °MAKE SURE YOU: °· Understand these instructions. °· Will watch your condition. °· Will get help right away if you are not doing well or get worse. °Document Released: 11/22/2004 Document Revised: 08/05/2013 Document Reviewed: 05/28/2013 °[employer]® Patient Information ©2015 [employer]. This information is not intended to replace advice given to you by your health care provider. Make sure you discuss any questions you have with your health care provider. ° °Sutured Wound Care °Sutures are stitches that can be used to close wounds. Wound care helps prevent pain and infection.  °HOME CARE INSTRUCTIONS  °· Rest and elevate the injured area until all the pain and swelling are gone. °· Only take over-the-counter or prescription medicines for pain, discomfort, or fever as directed by your caregiver. °· After 48 hours, gently wash the area with mild soap and water once a day, or as directed. Rinse off the soap. Pat the area dry with a clean towel. Do not rub the wound. This may cause bleeding. °· Follow your caregiver's instructions for how often to change the bandage (dressing). Stop using a dressing after 2 days or after the wound stops draining. °· If the dressing sticks, moisten it with soapy water and gently remove it. °· Apply ointment on the wound as directed. °· Avoid stretching a sutured wound. °· Drink enough fluids to keep your urine clear or pale yellow. °· Follow up with your caregiver for suture removal as directed. °· Use sunscreen on your wound for the next 3 to 6 months so the scar will not darken. °SEEK IMMEDIATE MEDICAL CARE IF:  °· Your wound becomes red, swollen, hot, or tender. °· You have increasing pain in the wound. °· You have a red streak that extends from the wound. °· There is pus coming from the wound. °· You have a fever. °· You have shaking chills. °· There is a  bad smell coming from the wound. °· You have persistent bleeding from the wound. °MAKE SURE YOU:  °· Understand these instructions. °· Will watch your condition. °· Will get help right away if you are not doing well or get worse. °Document Released: 11/22/2004 Document Revised: 01/07/2012 Document Reviewed: 02/18/2011 °[employer]® Patient Information ©2015 [employer]. This information is not intended to replace advice given to you by your health care provider. Make sure you discuss any questions you have with your health care provider. ° °

## 2014-10-06 ENCOUNTER — Encounter (HOSPITAL_COMMUNITY): Payer: Self-pay | Admitting: Cardiology

## 2014-10-07 ENCOUNTER — Encounter (HOSPITAL_COMMUNITY): Payer: Self-pay | Admitting: Internal Medicine

## 2017-05-17 ENCOUNTER — Emergency Department (HOSPITAL_COMMUNITY)
Admission: EM | Admit: 2017-05-17 | Discharge: 2017-05-18 | Disposition: A | Discharge status: Home / Self Care | Payer: Self-pay | Attending: Emergency Medicine | Admitting: Emergency Medicine

## 2017-05-17 ENCOUNTER — Emergency Department (HOSPITAL_COMMUNITY): Payer: Self-pay

## 2017-05-17 ENCOUNTER — Encounter (HOSPITAL_COMMUNITY): Payer: Self-pay | Admitting: Emergency Medicine

## 2017-05-17 DIAGNOSIS — W228XXA Striking against or struck by other objects, initial encounter: Secondary | ICD-10-CM | POA: Insufficient documentation

## 2017-05-17 DIAGNOSIS — Z23 Encounter for immunization: Secondary | ICD-10-CM | POA: Insufficient documentation

## 2017-05-17 DIAGNOSIS — Y999 Unspecified external cause status: Secondary | ICD-10-CM | POA: Insufficient documentation

## 2017-05-17 DIAGNOSIS — Z79899 Other long term (current) drug therapy: Secondary | ICD-10-CM | POA: Insufficient documentation

## 2017-05-17 DIAGNOSIS — Y939 Activity, unspecified: Secondary | ICD-10-CM | POA: Insufficient documentation

## 2017-05-17 DIAGNOSIS — F17210 Nicotine dependence, cigarettes, uncomplicated: Secondary | ICD-10-CM | POA: Insufficient documentation

## 2017-05-17 DIAGNOSIS — S61210A Laceration without foreign body of right index finger without damage to nail, initial encounter: Secondary | ICD-10-CM | POA: Insufficient documentation

## 2017-05-17 DIAGNOSIS — Y929 Unspecified place or not applicable: Secondary | ICD-10-CM | POA: Insufficient documentation

## 2017-05-17 MED ORDER — TETANUS-DIPHTH-ACELL PERTUSSIS 5-2.5-18.5 LF-MCG/0.5 IM SUSP
0.5000 mL | Freq: Once | INTRAMUSCULAR | Status: AC
  Administered 2017-05-18: 0.5 mL via INTRAMUSCULAR
  Filled 2017-05-17: qty 0.5

## 2017-05-17 MED ORDER — LIDOCAINE HCL (PF) 1 % IJ SOLN
30.0000 mL | Freq: Once | INTRAMUSCULAR | Status: AC
  Administered 2017-05-18: 30 mL

## 2017-05-17 NOTE — ED Triage Notes (Signed)
Pt reports unkn tetanus
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
## 2017-05-17 NOTE — ED Notes (Signed)
Doree AlbeeJessica PA and Adriana Simasook MD at bedside
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
## 2017-05-18 NOTE — ED Notes (Signed)
Patient Alert and oriented X4. Stable and ambulatory. Patient verbalized understanding of the discharge instructions.  Patient belongings were taken by the patient.  

## 2017-05-18 NOTE — Discharge Instructions (Signed)
As discussed, follow up with the hand surgeon Dr Merlyn LotKuzma on Monday.   Follow-up for suture removal in 7 days. Monitor for any signs of infection and return to be seen sooner if you experience redness, swelling, increased pain, purulent discharge, warm to touch, fever or other concerning symptoms in the meantime.

## 2017-05-27 ENCOUNTER — Encounter (HOSPITAL_COMMUNITY): Payer: Self-pay | Admitting: *Deleted

## 2017-05-27 ENCOUNTER — Ambulatory Visit (HOSPITAL_COMMUNITY)
Admission: EM | Admit: 2017-05-27 | Discharge: 2017-05-27 | Disposition: A | Discharge status: Home / Self Care | Payer: Self-pay

## 2017-05-27 DIAGNOSIS — Z4802 Encounter for removal of sutures: Secondary | ICD-10-CM

## 2017-05-27 NOTE — ED Triage Notes (Signed)
Here      For  Suture       Removal      Wound   Appears well healing

## 2017-05-27 NOTE — ED Notes (Signed)
Cleaned & removed 4 sutures from right index finger per rn. instructions

## 2017-05-27 NOTE — Discharge Instructions (Signed)
folllowup  If  Any  Problems

## 2017-07-02 ENCOUNTER — Emergency Department (HOSPITAL_COMMUNITY)
Admission: EM | Admit: 2017-07-02 | Discharge: 2017-07-02 | Disposition: A | Discharge status: Home / Self Care | Payer: Non-veteran care | Attending: Emergency Medicine | Admitting: Emergency Medicine

## 2017-07-02 ENCOUNTER — Encounter (HOSPITAL_COMMUNITY): Payer: Self-pay | Admitting: Emergency Medicine

## 2017-07-02 DIAGNOSIS — E876 Hypokalemia: Secondary | ICD-10-CM | POA: Insufficient documentation

## 2017-07-02 DIAGNOSIS — K122 Cellulitis and abscess of mouth: Secondary | ICD-10-CM | POA: Diagnosis not present

## 2017-07-02 DIAGNOSIS — F17210 Nicotine dependence, cigarettes, uncomplicated: Secondary | ICD-10-CM | POA: Insufficient documentation

## 2017-07-02 DIAGNOSIS — Z79899 Other long term (current) drug therapy: Secondary | ICD-10-CM | POA: Insufficient documentation

## 2017-07-02 DIAGNOSIS — Z791 Long term (current) use of non-steroidal anti-inflammatories (NSAID): Secondary | ICD-10-CM | POA: Insufficient documentation

## 2017-07-02 DIAGNOSIS — R600 Localized edema: Secondary | ICD-10-CM | POA: Diagnosis present

## 2017-07-02 LAB — CBC
HEMATOCRIT: 39.8 % (ref 39.0–52.0)
Hemoglobin: 13.8 g/dL (ref 13.0–17.0)
MCH: 31.8 pg (ref 26.0–34.0)
MCHC: 34.7 g/dL (ref 30.0–36.0)
MCV: 91.7 fL (ref 78.0–100.0)
Platelets: 266 10*3/uL (ref 150–400)
RBC: 4.34 MIL/uL (ref 4.22–5.81)
RDW: 12.2 % (ref 11.5–15.5)
WBC: 10.8 10*3/uL — ABNORMAL HIGH (ref 4.0–10.5)

## 2017-07-02 LAB — I-STAT CHEM 8, ED
BUN: 5 mg/dL — ABNORMAL LOW (ref 6–20)
Calcium, Ion: 1.17 mmol/L (ref 1.15–1.40)
Chloride: 104 mmol/L (ref 101–111)
Creatinine, Ser: 0.8 mg/dL (ref 0.61–1.24)
GLUCOSE: 93 mg/dL (ref 65–99)
HCT: 40 % (ref 39.0–52.0)
HEMOGLOBIN: 13.6 g/dL (ref 13.0–17.0)
Potassium: 3.8 mmol/L (ref 3.5–5.1)
Sodium: 141 mmol/L (ref 135–145)
TCO2: 26 mmol/L (ref 22–32)

## 2017-07-02 MED ORDER — ACETAMINOPHEN 325 MG PO TABS
650.0000 mg | ORAL_TABLET | Freq: Once | ORAL | Status: AC
  Administered 2017-07-02: 650 mg via ORAL
  Filled 2017-07-02: qty 2

## 2017-07-02 MED ORDER — OXYCODONE-ACETAMINOPHEN 5-325 MG PO TABS
1.0000 | ORAL_TABLET | Freq: Once | ORAL | Status: AC
  Administered 2017-07-02: 1 via ORAL
  Filled 2017-07-02: qty 1

## 2017-07-02 MED ORDER — CLINDAMYCIN HCL 150 MG PO CAPS: 450.0000 mg | ORAL_CAPSULE | Freq: Three times a day (TID) | ORAL | 0 refills | Status: AC

## 2017-07-02 NOTE — ED Provider Notes (Signed)
[facility] Provider Note   CSN: [id number] Arrival date & time: 07/02/17  1522     History   Chief Complaint Chief Complaint  Patient presents with  . Abscess    Facial    HPI Albert Robles is a 41 y.o. male presents to the ED for evaluation of right lower jaw swelling and tenderness noticed earlier this morning. States he was at baseline health last night prior to bed and woke up this morning and noticed the swelling, pain started later on in the day.  He also reports diffuse dental pain in upper and lower mouth, admits he has "bad teeth". Denies history of diabetes or IV drug use. No fevers, chills, trismus, difficulty swallowing, drooling, changes in voice, neck pain or stiffness. Has taken ibuprofen and Tylenol with minimal relief of symptoms.  HPI  Past Medical History:  Diagnosis Date  . Chest pain    in setting of SVT, NL Cath 10/04/2011  . SVT (supraventricular tachycardia) (HCC)    10/04/2011 - broke w vagal maneuvers  . Tobacco abuse    20 pack yr history - currently smoking 1ppd  . Wolf-Parkinson-White syndrome    Diagnosed 10/04/2011; NL LV fxn by echo 10/04/2011    Patient Active Problem List   Diagnosis Date Noted  . Chest pressure   . SVT (supraventricular tachycardia) (HCC) 10/05/2011  . Hypokalemia 10/04/2011  . Wolf-Parkinson-White syndrome   . Tobacco abuse     Past Surgical History:  Procedure Laterality Date  . CARDIAC CATHETERIZATION  10/04/11  . CARDIAC CATHETERIZATION  11/05/11  . CARDIAC ELECTROPHYSIOLOGY MAPPING AND ABLATION  11/05/11  . LEFT HEART CATHETERIZATION WITH CORONARY ANGIOGRAM N/A 10/04/2011   Procedure: LEFT HEART CATHETERIZATION WITH CORONARY ANGIOGRAM;  Surgeon: Laurey Morale, MD;  Location: [facility] CATH LAB;  Service: Cardiovascular;  Laterality: N/A;  . SUPRAVENTRICULAR TACHYCARDIA ABLATION N/A 11/05/2011   Procedure: SUPRAVENTRICULAR TACHYCARDIA ABLATION;  Surgeon: Marinus Maw, MD;  Location: [facility] CATH LAB;  Service:  Cardiovascular;  Laterality: N/A;       Home Medications    Prior to Admission medications   Medication Sig Start Date End Date Taking? Authorizing Provider  clindamycin (CLEOCIN) 150 MG capsule Take 3 capsules (450 mg total) by mouth 3 (three) times daily. 07/02/17 07/09/17  Liberty Handy, PA-C  diazepam (VALIUM) 5 MG tablet Take 1 tablet (5 mg total) by mouth every 8 (eight) hours as needed for muscle spasms. 09/18/13   Ward, Layla Maw, DO  ibuprofen (ADVIL,MOTRIN) 800 MG tablet Take 1 tablet (800 mg total) by mouth 3 (three) times daily. 09/18/13   Ward, Layla Maw, DO  methylPREDNISolone (MEDROL DOSEPAK) 4 MG tablet Take as directed 09/18/13   Ward, Layla Maw, DO  oxyCODONE-acetaminophen (PERCOCET/ROXICET) 5-325 MG per tablet Take 1 tablet by mouth every 4 (four) hours as needed. 09/18/13   Ward, Layla Maw, DO    Family History No family history on file.  Social History Social History  Substance Use Topics  . Smoking status: Current Every Day Smoker    Packs/day: 1.00    Years: 20.00    Types: Cigarettes  . Smokeless tobacco: Never Used  . Alcohol use No     Allergies   Patient has no known allergies.   Review of Systems Review of Systems  Constitutional: Negative for chills, diaphoresis and fever.  HENT: Positive for dental problem and facial swelling. Negative for drooling, sore throat, trouble swallowing and voice change.   Respiratory: Negative for choking.  Musculoskeletal: Negative for neck pain and neck stiffness.     Physical Exam Updated Vital Signs BP 134/83 (BP Location: Right Arm)   Pulse 91   Temp 99.5 F (37.5 C)   Resp 18   Ht 5\' 11"  (1.803 m)   Wt 74.8 kg (165 lb)   SpO2 99%   BMI 23.01 kg/m   Physical Exam  Constitutional: He is oriented to person, place, and time. He appears well-developed and well-nourished. No distress.  NAD.  HENT:  Head: Normocephalic and atraumatic.  Right Ear: External ear normal.  Left Ear: External ear  normal.  Nose: Nose normal.  Mouth/Throat: Abnormal dentition. Dental caries present.    +Poor dentition throughout +Tenderness at right anterior mandible with mild edema, no fluctuance or induration  Bottom lip was swept and visualized, no evidence of abscess  No anterior neck edema, erythema or erythema. No sublingual edema or tenderness.  Soft palate flat without tenderness.  No trismus.   No pooling of oral secretions.  Phonation normal, no hot potato voice.  Maxilla nontender. Mastoids without edema, erythema or tenderness.    Eyes: Conjunctivae and EOM are normal. No scleral icterus.  Neck: Normal range of motion. Neck supple.  Cardiovascular: Normal rate, regular rhythm, normal heart sounds and intact distal pulses.   No murmur heard. Pulmonary/Chest: Effort normal and breath sounds normal. He has no wheezes.  Musculoskeletal: Normal range of motion. He exhibits no deformity.  Neurological: He is alert and oriented to person, place, and time.  Skin: Skin is warm and dry. Capillary refill takes less than 2 seconds.  Psychiatric: He has a normal mood and affect. His behavior is normal. Judgment and thought content normal.  Nursing note and vitals reviewed.    ED Treatments / Results  Labs (all labs ordered are listed, but only abnormal results are displayed) Labs Reviewed  CBC - Abnormal; Notable for the following:       Result Value   WBC 10.8 (*)    All other components within normal limits  I-STAT CHEM 8, ED - Abnormal; Notable for the following:    BUN 5 (*)    All other components within normal limits    EKG  EKG Interpretation None       Radiology No results found.  Procedures Procedures (including critical care time)  Medications Ordered in ED Medications  oxyCODONE-acetaminophen (PERCOCET/ROXICET) 5-325 MG per tablet 1 tablet (not administered)  acetaminophen (TYLENOL) tablet 650 mg (not administered)     Initial Impression / Assessment and  Plan / ED Course  I have reviewed the triage vital signs and the nursing notes.  Pertinent labs & imaging results that were available during my care of the patient were reviewed by me and considered in my medical decision making (see chart for details).   41-year-old male presents to ED for evaluation of edema and tenderness to the right lower mandible onset earlier this morning. On exam he has very poor dentition throughout and focal edema and tenderness to the right lower mandible but no evidence of abscess. Incision and drainage not indicated today. No fevers, chills, myalgias, trismus, drooling, changes in voice, neck stiffness or pain. Ptient afebrile, non toxic appearing, swallowing secretions well without hot potato voice. Exam unconcerning for Ludwig's angina or other deep tissue infection in neck.We'll discharge with clindamycin, high-dose NSAIDs, warm compresses and dentist follow-up. Discussed return precautions that would warrant return to the ED.     Final Clinical Impressions(s) / ED Diagnoses  Final diagnoses:  Abscess of soft tissue of mouth    New Prescriptions New Prescriptions   CLINDAMYCIN (CLEOCIN) 150 MG CAPSULE    Take 3 capsules (450 mg total) by mouth 3 (three) times daily.     Liberty Handy, PA-C 07/02/17 Gayla Doss, MD 07/03/17 [phone]

## 2017-07-02 NOTE — ED Notes (Signed)
Pt has swelling/numbness to right jaw line but states that it does not hurt on right side but hurts on left.

## 2017-07-02 NOTE — Discharge Instructions (Signed)
You were evaluated in the emergency department for swelling and tenderness to the right bottom lip. Exam is consistent with superficial infection of your skin. There was no abscess to drain today. Please take clindamycin as prescribed. Apply warm compress or heating pad to the area. Take 400 mg of ibuprofen +1000 mg of Tylenol every 8 hours for pain and inflammation. After 3 days of antibiotics her symptoms should start to improve. Return to the emergency department for fevers, chills, worsening swelling, redness, pain, difficulty opening/closing jaw, drooling, difficulty breathing.  Contact dentist as listed above within 2 days for reevaluation. You should establish care with a dentist for routine dental care.

## 2017-07-02 NOTE — ED Triage Notes (Signed)
Pt to ED c/o R lower jaw abscess since last night. Significant swelling noted to the area, pt reports his dental hygiene is not the best and thinks it is related to that. Pt denies fevers/chills.

## 2018-01-13 ENCOUNTER — Emergency Department (HOSPITAL_COMMUNITY)
Admission: EM | Admit: 2018-01-13 | Discharge: 2018-01-13 | Disposition: A | Discharge status: Home / Self Care | Payer: Non-veteran care | Attending: Physician Assistant | Admitting: Physician Assistant

## 2018-01-13 ENCOUNTER — Encounter (HOSPITAL_COMMUNITY): Payer: Self-pay | Admitting: Emergency Medicine

## 2018-01-13 DIAGNOSIS — R600 Localized edema: Secondary | ICD-10-CM | POA: Diagnosis present

## 2018-01-13 DIAGNOSIS — F17210 Nicotine dependence, cigarettes, uncomplicated: Secondary | ICD-10-CM | POA: Insufficient documentation

## 2018-01-13 DIAGNOSIS — K047 Periapical abscess without sinus: Secondary | ICD-10-CM | POA: Insufficient documentation

## 2018-01-13 DIAGNOSIS — L03211 Cellulitis of face: Secondary | ICD-10-CM

## 2018-01-13 LAB — COMPREHENSIVE METABOLIC PANEL
ALT: 26 U/L (ref 17–63)
AST: 23 U/L (ref 15–41)
Albumin: 3.6 g/dL (ref 3.5–5.0)
Alkaline Phosphatase: 47 U/L (ref 38–126)
Anion gap: 9 (ref 5–15)
BILIRUBIN TOTAL: 0.5 mg/dL (ref 0.3–1.2)
BUN: 7 mg/dL (ref 6–20)
CHLORIDE: 109 mmol/L (ref 101–111)
CO2: 25 mmol/L (ref 22–32)
Calcium: 8.7 mg/dL — ABNORMAL LOW (ref 8.9–10.3)
Creatinine, Ser: 0.97 mg/dL (ref 0.61–1.24)
GFR calc Af Amer: >60 mL/min (ref >60)
Glucose, Bld: 91 mg/dL (ref 65–99)
Potassium: 4.2 mmol/L (ref 3.5–5.1)
Sodium: 143 mmol/L (ref 135–145)
Total Protein: 6.2 g/dL — ABNORMAL LOW (ref 6.5–8.1)

## 2018-01-13 LAB — CBC WITH DIFFERENTIAL/PLATELET
Basophils Absolute: 0 10*3/uL (ref 0.0–0.1)
Basophils Relative: 0 %
EOS PCT: 3 %
Eosinophils Absolute: 0.3 10*3/uL (ref 0.0–0.7)
HCT: 37.8 % — ABNORMAL LOW (ref 39.0–52.0)
Hemoglobin: 13.2 g/dL (ref 13.0–17.0)
Lymphocytes Relative: 22 %
Lymphs Abs: 2.2 10*3/uL (ref 0.7–4.0)
MCH: 33 pg (ref 26.0–34.0)
MCHC: 34.9 g/dL (ref 30.0–36.0)
MCV: 94.5 fL (ref 78.0–100.0)
Monocytes Absolute: 0.8 10*3/uL (ref 0.1–1.0)
Monocytes Relative: 8 %
Neutro Abs: 6.8 10*3/uL (ref 1.7–7.7)
Neutrophils Relative %: 67 %
PLATELETS: 222 10*3/uL (ref 150–400)
RBC: 4 MIL/uL — ABNORMAL LOW (ref 4.22–5.81)
RDW: 12.3 % (ref 11.5–15.5)
WBC: 10.1 10*3/uL (ref 4.0–10.5)

## 2018-01-13 MED ORDER — NAPROXEN 500 MG PO TABS
500.0000 mg | ORAL_TABLET | Freq: Once | ORAL | Status: AC
  Administered 2018-01-13: 500 mg via ORAL
  Filled 2018-01-13: qty 1

## 2018-01-13 MED ORDER — PENICILLIN V POTASSIUM 500 MG PO TABS: 500.0000 mg | ORAL_TABLET | Freq: Four times a day (QID) | ORAL | 0 refills | Status: AC

## 2018-01-13 MED ORDER — PENICILLIN V POTASSIUM 500 MG PO TABS
500.0000 mg | ORAL_TABLET | Freq: Once | ORAL | Status: AC
  Administered 2018-01-13: 500 mg via ORAL
  Filled 2018-01-13: qty 1

## 2018-01-13 MED ORDER — NAPROXEN 500 MG PO TABS: 500.0000 mg | ORAL_TABLET | Freq: Two times a day (BID) | ORAL | 0 refills | Status: AC

## 2018-01-13 NOTE — ED Provider Notes (Signed)
[facility]-EMERGENCY DEPT Provider Note   CSN: [id number] Arrival date & time: 01/13/18  1758     History   Chief Complaint Chief Complaint  Patient presents with  . Facial Swelling    HPI Albert Robles is a 42 y.o. male.   42 year old male with a history of WPW, SVT presents to the emergency department for evaluation of facial redness and swelling.  He states that he noticed some dental pain this morning shortly after waking.  This was present to his right upper dentition.  He has had progressive swelling and redness to the right side of his face which has progressed throughout the day.  He took some Tylenol this morning with mild improvement.  He does report some paresthesias to his area of facial edema which began later this morning.  He has not had any drainage from the nose or mouth.  No associated fevers, inability to swallow, difficulty opening his jaw.  He reports a history of similar symptoms a few years ago.  He is not currently followed by a dentist.      Past Medical History:  Diagnosis Date  . Chest pain    in setting of SVT, NL Cath 10/04/2011  . SVT (supraventricular tachycardia) (HCC)    10/04/2011 - broke w vagal maneuvers  . Tobacco abuse    20 pack yr history - currently smoking 1ppd  . Wolf-Parkinson-White syndrome    Diagnosed 10/04/2011; NL LV fxn by echo 10/04/2011    Patient Active Problem List   Diagnosis Date Noted  . Chest pressure   . SVT (supraventricular tachycardia) (HCC) 10/05/2011  . Hypokalemia 10/04/2011  . Wolf-Parkinson-White syndrome   . Tobacco abuse     Past Surgical History:  Procedure Laterality Date  . CARDIAC CATHETERIZATION  10/04/11  . CARDIAC CATHETERIZATION  11/05/11  . CARDIAC ELECTROPHYSIOLOGY MAPPING AND ABLATION  11/05/11  . LEFT HEART CATHETERIZATION WITH CORONARY ANGIOGRAM N/A 10/04/2011   Procedure: LEFT HEART CATHETERIZATION WITH CORONARY ANGIOGRAM;  Surgeon: Laurey Morale, MD;  Location: [facility] CATH  LAB;  Service: Cardiovascular;  Laterality: N/A;  . SUPRAVENTRICULAR TACHYCARDIA ABLATION N/A 11/05/2011   Procedure: SUPRAVENTRICULAR TACHYCARDIA ABLATION;  Surgeon: Marinus Maw, MD;  Location: [facility] CATH LAB;  Service: Cardiovascular;  Laterality: N/A;       Home Medications    Prior to Admission medications   Medication Sig Start Date End Date Taking? Authorizing Provider  acetaminophen (TYLENOL) 500 MG tablet Take 500 mg by mouth every 6 (six) hours as needed for moderate pain.   Yes [provider]  diazepam (VALIUM) 5 MG tablet Take 1 tablet (5 mg total) by mouth every 8 (eight) hours as needed for muscle spasms. Patient not taking: Reported on 01/13/2018 09/18/13   Ward, Layla Maw, DO  methylPREDNISolone (MEDROL DOSEPAK) 4 MG tablet Take as directed Patient not taking: Reported on 01/13/2018 09/18/13   Ward, Layla Maw, DO  naproxen (NAPROSYN) 500 MG tablet Take 1 tablet (500 mg total) by mouth 2 (two) times daily. 01/13/18   Antony Madura, PA-C  oxyCODONE-acetaminophen (PERCOCET/ROXICET) 5-325 MG per tablet Take 1 tablet by mouth every 4 (four) hours as needed. Patient not taking: Reported on 01/13/2018 09/18/13   Ward, Layla Maw, DO  penicillin v potassium (VEETID) 500 MG tablet Take 1 tablet (500 mg total) by mouth 4 (four) times daily for 10 days. 01/13/18 01/23/18  Antony Madura, PA-C    Family History No family history on file.  Social History Social  History   Tobacco Use  . Smoking status: Current Every Day Smoker    Packs/day: 1.00    Years: 20.00    Pack years: 20.00    Types: Cigarettes  . Smokeless tobacco: Never Used  Substance Use Topics  . Alcohol use: No  . Drug use: No     Allergies   Patient has no known allergies.   Review of Systems Review of Systems Ten systems reviewed and are negative for acute change, except as noted in the HPI.    Physical Exam Updated Vital Signs BP 118/84 (BP Location: Left Arm)   Pulse 80   Temp 99.9 F (37.7 C)  (Oral)   Resp 15   Ht 5\' 11"  (1.803 m)   Wt 82.6 kg (182 lb)   SpO2 99%   BMI 25.38 kg/m   Physical Exam  Constitutional: He is oriented to person, place, and time. He appears well-developed and well-nourished. No distress.  Nontoxic appearing and in no acute distress  HENT:  Head: Normocephalic and atraumatic.  Right Ear: Tympanic membrane, external ear and ear canal normal.  Left Ear: Tympanic membrane, external ear and ear canal normal.  Nose: No mucosal edema (no evidence of abscess in nares bilaterally), septal deviation or nasal septal hematoma.    Mouth/Throat: Oropharynx is clear and moist. No trismus in the jaw. Abnormal dentition. Dental caries present.  Gross dental decay and caries to upper dentition.  No gingival fluctuance.  No oral bleeding or purulence.  There is tenderness to the right face, corresponding with area of likely infection.  No heat to touch or streaking.  Mild edema below the right eye.  Eyes: Conjunctivae and EOM are normal. No scleral icterus.  No pain with EOMs  Neck: Normal range of motion.  No nuchal rigidity or meningismus  Cardiovascular: Normal rate, regular rhythm and intact distal pulses.  Pulmonary/Chest: Effort normal. No stridor. No respiratory distress. He has no wheezes.  Respirations even and unlabored  Musculoskeletal: Normal range of motion.  Neurological: He is alert and oriented to person, place, and time. He exhibits normal muscle tone. Coordination normal.  Skin: Skin is warm and dry. No rash noted. He is not diaphoretic. No erythema. No pallor.  Psychiatric: He has a normal mood and affect. His behavior is normal.  Nursing note and vitals reviewed.    ED Treatments / Results  Labs (all labs ordered are listed, but only abnormal results are displayed) Labs Reviewed  COMPREHENSIVE METABOLIC PANEL - Abnormal; Notable for the following components:      Result Value   Calcium 8.7 (*)    Total Protein 6.2 (*)    All other  components within normal limits  CBC WITH DIFFERENTIAL/PLATELET - Abnormal; Notable for the following components:   RBC 4.00 (*)    HCT 37.8 (*)    All other components within normal limits    EKG  EKG Interpretation None       Radiology No results found.  Procedures Procedures (including critical care time)  Medications Ordered in ED Medications  naproxen (NAPROSYN) tablet 500 mg (500 mg Oral Given 01/13/18 2218)  penicillin v potassium (VEETID) tablet 500 mg (500 mg Oral Given 01/13/18 2218)     Initial Impression / Assessment and Plan / ED Course  I have reviewed the triage vital signs and the nursing notes.  Pertinent labs & imaging results that were available during my care of the patient were reviewed by me and considered in my  medical decision making (see chart for details).     Patient with toothache and associated secondary swelling and erythema to face on the right.  Afebrile.  No leukocytosis.  No gross abscess on exam.  Exam unconcerning for Ludwig's angina or spread of infection.  Will treat with penicillin and pain medicine.  Urged patient to follow-up with dentist.  Return precautions discussed and provided. Patient discharged in stable condition with no unaddressed concerns.   Final Clinical Impressions(s) / ED Diagnoses   Final diagnoses:  Dental infection  Facial cellulitis    ED Discharge Orders        Ordered    naproxen (NAPROSYN) 500 MG tablet  2 times daily     01/13/18 2210    penicillin v potassium (VEETID) 500 MG tablet  4 times daily     01/13/18 2210       Antony Madura, PA-C 01/13/18 2302    Abelino Derrick, MD 01/19/18 0230

## 2018-01-13 NOTE — ED Triage Notes (Addendum)
Patient presents with right sided facial swelling onset of this morning. Facial numbness started around 10am today. Redness noted right cheek under eye. No warmth noted.

## 2018-01-13 NOTE — Discharge Instructions (Signed)
Take Penicillin as prescribed until finished. You may take Naproxen for pain and inflammation as prescribed. Follow up with a dentist as soon as you are able. You may return for new or concerning symptoms.

## 2018-06-09 ENCOUNTER — Other Ambulatory Visit: Payer: Self-pay

## 2018-06-09 ENCOUNTER — Emergency Department (HOSPITAL_COMMUNITY)
Admission: EM | Admit: 2018-06-09 | Discharge: 2018-06-10 | Disposition: A | Discharge status: Home / Self Care | Payer: Non-veteran care | Attending: Emergency Medicine | Admitting: Emergency Medicine

## 2018-06-09 DIAGNOSIS — K0889 Other specified disorders of teeth and supporting structures: Secondary | ICD-10-CM | POA: Insufficient documentation

## 2018-06-09 DIAGNOSIS — Z5321 Procedure and treatment not carried out due to patient leaving prior to being seen by health care provider: Secondary | ICD-10-CM | POA: Insufficient documentation

## 2018-06-09 DIAGNOSIS — R51 Headache: Secondary | ICD-10-CM | POA: Diagnosis not present

## 2018-06-09 DIAGNOSIS — Z98818 Other dental procedure status: Secondary | ICD-10-CM | POA: Insufficient documentation

## 2018-06-09 NOTE — ED Triage Notes (Signed)
Patient states that he had 4 teeth pulled Saturday morning and began running a fever today. Has been taking the abx prescribed by previous provider but feels worse. States that he has swelling in face.

## 2018-06-10 NOTE — ED Notes (Signed)
Pt did not respond when called for vitals x2

## 2018-06-10 NOTE — ED Notes (Signed)
Pt called for recheck vitals, no answer 

## 2018-06-11 NOTE — ED Notes (Signed)
Follow up call made  No answer  06/11/18  0826  s Raizy Auzenne rn

## 2019-01-15 IMAGING — DX DG FINGER INDEX 2+V*R*
3 series · 3 of 3 positions shown · non-contrast
Comparison: None.

CLINICAL DATA: Index finger pain after injury. Punched a head light
with laceration.

EXAM:
RIGHT INDEX FINGER 2+V

[finger ap]
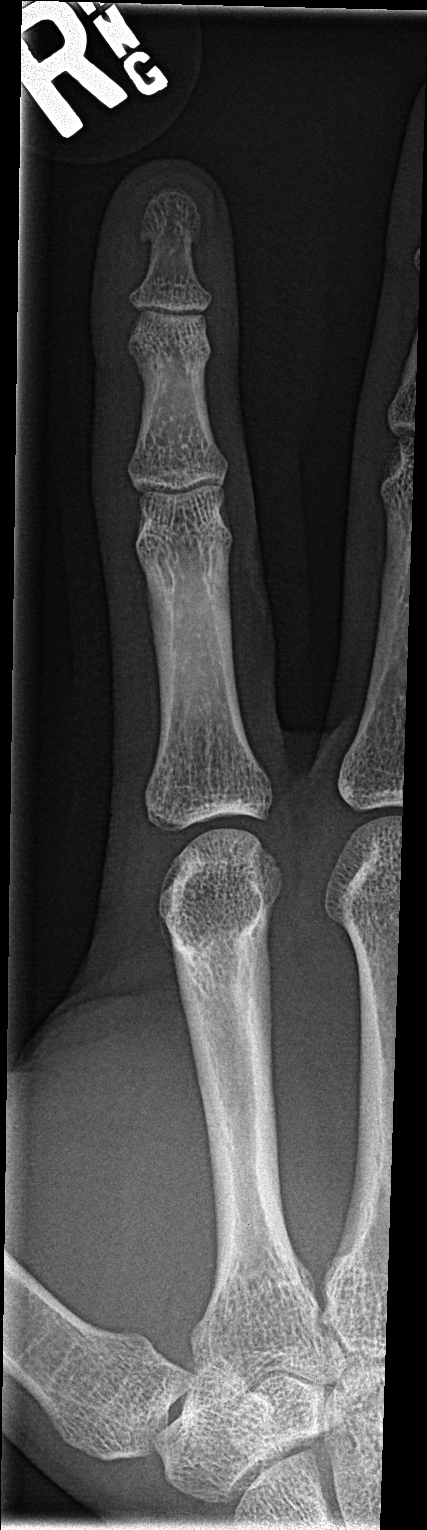

[finger obl]
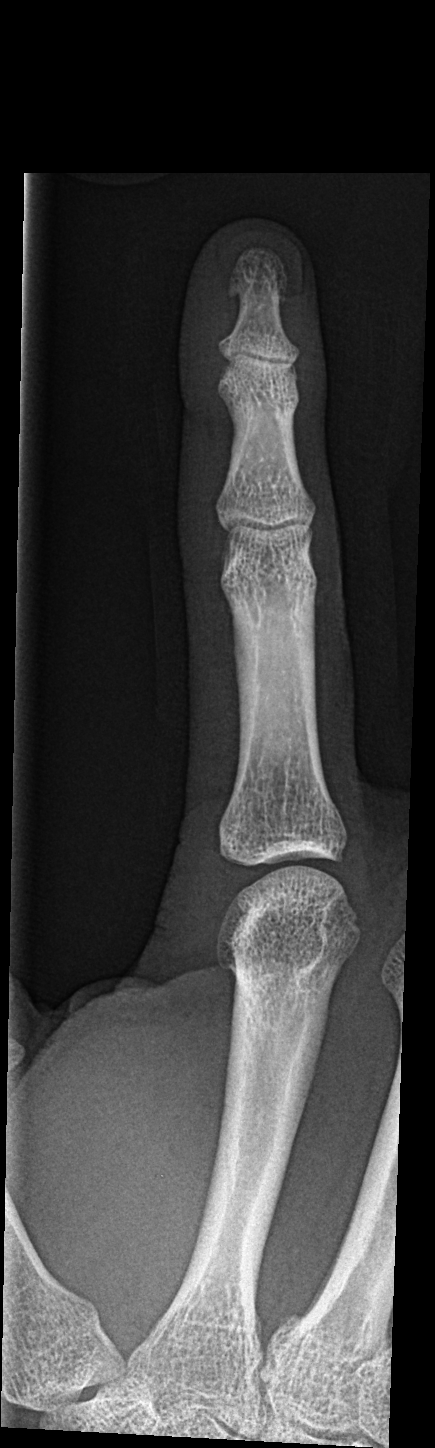

[finger lat]
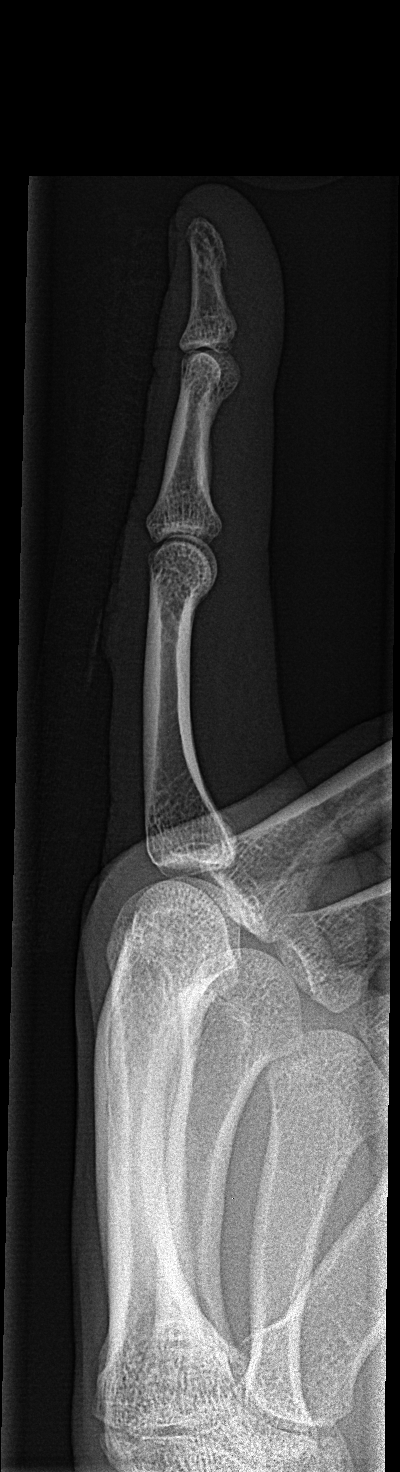

[3 of 3 positions shown; findings below may reference images not displayed]

FINDINGS: There is no evidence of fracture or dislocation. There is no
evidence of arthropathy or other focal bone abnormality. Skin
irregularity of the at the dorsal ulnar aspect of the proximal digit
with overlying dressing in place. No evidence of radiopaque foreign
body.
IMPRESSION: No fracture, dislocation, or radiopaque foreign body. Skin
irregularity with overlying dressing in place.

## 2020-05-05 ENCOUNTER — Encounter (HOSPITAL_COMMUNITY): Payer: Self-pay | Admitting: Emergency Medicine

## 2020-05-05 ENCOUNTER — Emergency Department (HOSPITAL_COMMUNITY)
Admission: EM | Admit: 2020-05-05 | Discharge: 2020-05-06 | Disposition: A | Discharge status: Home / Self Care | Payer: No Typology Code available for payment source | Attending: Emergency Medicine | Admitting: Emergency Medicine

## 2020-05-05 ENCOUNTER — Ambulatory Visit (HOSPITAL_COMMUNITY)
Admission: EM | Admit: 2020-05-05 | Discharge: 2020-05-05 | Disposition: A | Discharge status: Home / Self Care | Payer: No Typology Code available for payment source

## 2020-05-05 ENCOUNTER — Other Ambulatory Visit: Payer: Self-pay

## 2020-05-05 DIAGNOSIS — Z5321 Procedure and treatment not carried out due to patient leaving prior to being seen by health care provider: Secondary | ICD-10-CM | POA: Diagnosis not present

## 2020-05-05 DIAGNOSIS — K0889 Other specified disorders of teeth and supporting structures: Secondary | ICD-10-CM | POA: Diagnosis not present

## 2020-05-05 DIAGNOSIS — K1379 Other lesions of oral mucosa: Secondary | ICD-10-CM

## 2020-05-05 LAB — CBC WITH DIFFERENTIAL/PLATELET
Abs Immature Granulocytes: 0.05 10*3/uL (ref 0.00–0.07)
Basophils Absolute: 0 10*3/uL (ref 0.0–0.1)
Basophils Relative: 0 %
Eosinophils Absolute: 0.1 10*3/uL (ref 0.0–0.5)
Eosinophils Relative: 1 %
HCT: 44.2 % (ref 39.0–52.0)
Hemoglobin: 15.8 g/dL (ref 13.0–17.0)
Immature Granulocytes: 0 %
Lymphocytes Relative: 9 %
Lymphs Abs: 1.3 10*3/uL (ref 0.7–4.0)
MCH: 33.3 pg (ref 26.0–34.0)
MCHC: 35.7 g/dL (ref 30.0–36.0)
MCV: 93.2 fL (ref 80.0–100.0)
Monocytes Absolute: 0.9 10*3/uL (ref 0.1–1.0)
Monocytes Relative: 6 %
Neutro Abs: 12.3 10*3/uL — ABNORMAL HIGH (ref 1.7–7.7)
Neutrophils Relative %: 84 %
Platelets: 254 10*3/uL (ref 150–400)
RBC: 4.74 MIL/uL (ref 4.22–5.81)
RDW: 11.8 % (ref 11.5–15.5)
WBC: 14.7 10*3/uL — ABNORMAL HIGH (ref 4.0–10.5)
nRBC: 0 % (ref 0.0–0.2)

## 2020-05-05 LAB — PROTIME-INR
INR: 1.1 (ref 0.8–1.2)
Prothrombin Time: 13.3 seconds (ref 11.4–15.2)

## 2020-05-05 NOTE — ED Triage Notes (Signed)
Patient had a dental appt today.  22 teeth were pulled today.  Patient reports he is having a lot of bleeding.

## 2020-05-05 NOTE — ED Provider Notes (Signed)
[facility]    CSN: [id number] Arrival date & time: 05/05/20  1837      History   Chief Complaint Chief Complaint  Patient presents with  . Dental Pain    HPI Albert Robles is a 44 y.o. male.   Patient reports urgent care for mouth bleeding.  He said he had several up to 20 teeth removed today at the dentist.  He reports this occurred around 230.  He reports he had dentures placed.  Since then he has had persistent bleeding from his mouth.  He has tried biting on gauze the whole day however this is continued.     Past Medical History:  Diagnosis Date  . Chest pain    in setting of SVT, NL Cath 10/04/2011  . SVT (supraventricular tachycardia) (HCC)    10/04/2011 - broke w vagal maneuvers  . Tobacco abuse    20 pack yr history - currently smoking 1ppd  . Wolf-Parkinson-White syndrome    Diagnosed 10/04/2011; NL LV fxn by echo 10/04/2011    Patient Active Problem List   Diagnosis Date Noted  . Chest pressure   . SVT (supraventricular tachycardia) (HCC) 10/05/2011  . Hypokalemia 10/04/2011  . Wolf-Parkinson-White syndrome   . Tobacco abuse     Past Surgical History:  Procedure Laterality Date  . CARDIAC CATHETERIZATION  10/04/11  . CARDIAC CATHETERIZATION  11/05/11  . CARDIAC ELECTROPHYSIOLOGY MAPPING AND ABLATION  11/05/11  . LEFT HEART CATHETERIZATION WITH CORONARY ANGIOGRAM N/A 10/04/2011   Procedure: LEFT HEART CATHETERIZATION WITH CORONARY ANGIOGRAM;  Surgeon: Laurey Morale, MD;  Location: [facility] CATH LAB;  Service: Cardiovascular;  Laterality: N/A;  . SUPRAVENTRICULAR TACHYCARDIA ABLATION N/A 11/05/2011   Procedure: SUPRAVENTRICULAR TACHYCARDIA ABLATION;  Surgeon: Marinus Maw, MD;  Location: [facility] CATH LAB;  Service: Cardiovascular;  Laterality: N/A;       Home Medications    Prior to Admission medications   Medication Sig Start Date End Date Taking? Authorizing Provider  acetaminophen (TYLENOL) 500 MG tablet Take 500 mg by mouth every 6 (six) hours as  needed for moderate pain.    [provider]  diazepam (VALIUM) 5 MG tablet Take 1 tablet (5 mg total) by mouth every 8 (eight) hours as needed for muscle spasms. Patient not taking: Reported on 01/13/2018 09/18/13   Ward, Layla Maw, DO  methylPREDNISolone (MEDROL DOSEPAK) 4 MG tablet Take as directed Patient not taking: Reported on 01/13/2018 09/18/13   Ward, Layla Maw, DO  naproxen (NAPROSYN) 500 MG tablet Take 1 tablet (500 mg total) by mouth 2 (two) times daily. 01/13/18   Antony Madura, PA-C  oxyCODONE-acetaminophen (PERCOCET/ROXICET) 5-325 MG per tablet Take 1 tablet by mouth every 4 (four) hours as needed. Patient not taking: Reported on 01/13/2018 09/18/13   Ward, Layla Maw, DO    Family History Family History  Problem Relation Age of Onset  . Healthy Mother     Social History Social History   Tobacco Use  . Smoking status: Current Every Day Smoker    Packs/day: 1.00    Years: 20.00    Pack years: 20.00    Types: Cigarettes  . Smokeless tobacco: Never Used  Substance Use Topics  . Alcohol use: No  . Drug use: No     Allergies   Patient has no known allergies.   Review of Systems Review of Systems   Physical Exam Triage Vital Signs ED Triage Vitals  Enc Vitals Group     BP 05/05/20 1934 127/86  Pulse Rate 05/05/20 1934 98     Resp 05/05/20 1934 20     Temp --      Temp Source 05/05/20 1934 Oral     SpO2 05/05/20 1934 100 %     Weight --      Height --      Head Circumference --      Peak Flow --      Pain Score 05/05/20 1932 0     Pain Loc --      Pain Edu? --      Excl. in GC? --    No data found.  Updated Vital Signs BP 127/86 (BP Location: Left Arm)   Pulse 98   Resp 20   SpO2 100%   Visual Acuity Right Eye Distance:   Left Eye Distance:   Bilateral Distance:    Right Eye Near:   Left Eye Near:    Bilateral Near:     Physical Exam Vitals and nursing note reviewed.  Constitutional:      Appearance: He is well-developed.    HENT:     Head: Normocephalic and atraumatic.     Mouth/Throat:     Comments: Several locations of bleeding in the mouth, he is controlling's secretions and blood and protecting his airway, however bleeding is not stopped with direct pressure. Eyes:     Conjunctiva/sclera: Conjunctivae normal.  Cardiovascular:     Rate and Rhythm: Normal rate.  Pulmonary:     Effort: Pulmonary effort is normal. No respiratory distress.  Musculoskeletal:     Cervical back: Neck supple.  Skin:    General: Skin is warm and dry.  Neurological:     Mental Status: He is alert.      UC Treatments / Results  Labs (all labs ordered are listed, but only abnormal results are displayed) Labs Reviewed - No data to display  EKG   Radiology No results found.  Procedures Procedures (including critical care time)  Medications Ordered in UC Medications - No data to display  Initial Impression / Assessment and Plan / UC Course  I have reviewed the triage vital signs and the nursing notes.  Pertinent labs & imaging results that were available during my care of the patient were reviewed by me and considered in my medical decision making (see chart for details).     *#Bleeding from mouth Patient is a 44 year old presenting after oral procedure earlier with persistent mouth bleeding.  Given we do not have an option to stop his bleeding tonight, recommend he be further evaluated in the emergency department for further evaluation tonight.  Patient agrees to this.  Patient has stable vital signs and is controlling his airway, able to self report. Final Clinical Impressions(s) / UC Diagnoses   Final diagnoses:  Bleeding from mouth     Discharge Instructions     You need to be further evaluated in the Emergency Department tonight, report there following discharge   ED Prescriptions    None     PDMP not reviewed this encounter.   Hermelinda Medicus, PA-C 05/05/20 2018

## 2020-05-05 NOTE — ED Triage Notes (Signed)
Patient reports persistent bleeding at gums after dental extraction ( 22 teeth) today .

## 2020-05-05 NOTE — Discharge Instructions (Signed)
You need to be further evaluated in the Emergency Department tonight, report there following discharge

## 2020-05-05 NOTE — ED Notes (Signed)
Patient is being discharged from the Urgent Care and sent to the Emergency Department via private vehicle . Per Cam Hai, NP, patient is in need of higher level of care due to degree of bleeding. Patient is aware and verbalizes understanding of plan of care.  Vitals:   05/05/20 1934  BP: 127/86  Pulse: 98  Resp: 20  SpO2: 100%

## 2020-05-06 LAB — BASIC METABOLIC PANEL
Anion gap: 9 (ref 5–15)
BUN: 6 mg/dL (ref 6–20)
CO2: 25 mmol/L (ref 22–32)
Calcium: 9.5 mg/dL (ref 8.9–10.3)
Chloride: 109 mmol/L (ref 98–111)
Creatinine, Ser: 1.1 mg/dL (ref 0.61–1.24)
GFR calc Af Amer: >60 mL/min (ref >60)
GFR calc non Af Amer: >60 mL/min (ref >60)
Glucose, Bld: 102 mg/dL — ABNORMAL HIGH (ref 70–99)
Potassium: 3.9 mmol/L (ref 3.5–5.1)
Sodium: 143 mmol/L (ref 135–145)

## 2020-05-06 NOTE — ED Notes (Signed)
lwbs

## 2023-08-23 ENCOUNTER — Other Ambulatory Visit: Payer: Self-pay

## 2023-08-23 ENCOUNTER — Emergency Department (HOSPITAL_BASED_OUTPATIENT_CLINIC_OR_DEPARTMENT_OTHER)
Admission: EM | Admit: 2023-08-23 | Discharge: 2023-08-24 | Disposition: A | Discharge status: Home / Self Care | Payer: No Typology Code available for payment source | Attending: Emergency Medicine | Admitting: Emergency Medicine

## 2023-08-23 ENCOUNTER — Encounter (HOSPITAL_BASED_OUTPATIENT_CLINIC_OR_DEPARTMENT_OTHER): Payer: Self-pay

## 2023-08-23 DIAGNOSIS — X500XXA Overexertion from strenuous movement or load, initial encounter: Secondary | ICD-10-CM | POA: Diagnosis not present

## 2023-08-23 DIAGNOSIS — M5431 Sciatica, right side: Secondary | ICD-10-CM | POA: Insufficient documentation

## 2023-08-23 DIAGNOSIS — F17200 Nicotine dependence, unspecified, uncomplicated: Secondary | ICD-10-CM | POA: Diagnosis not present

## 2023-08-23 DIAGNOSIS — M549 Dorsalgia, unspecified: Secondary | ICD-10-CM | POA: Diagnosis present

## 2023-08-23 MED ORDER — CYCLOBENZAPRINE HCL 5 MG PO TABS
5.0000 mg | ORAL_TABLET | Freq: Once | ORAL | Status: AC
  Administered 2023-08-24: 5 mg via ORAL
  Filled 2023-08-23: qty 1

## 2023-08-23 MED ORDER — KETOROLAC TROMETHAMINE 30 MG/ML IJ SOLN
30.0000 mg | Freq: Once | INTRAMUSCULAR | Status: AC
  Administered 2023-08-24: 30 mg via INTRAMUSCULAR
  Filled 2023-08-23: qty 1

## 2023-08-23 NOTE — ED Triage Notes (Signed)
Pt reports low back pain x24 hours after lifting something heavy. Pt states that he heard a pop.

## 2023-08-24 MED ORDER — CYCLOBENZAPRINE HCL 10 MG PO TABS: 10.0000 mg | ORAL_TABLET | Freq: Two times a day (BID) | ORAL | 0 refills | Status: AC | PRN

## 2023-08-24 MED ORDER — NAPROXEN 500 MG PO TABS: 500.0000 mg | ORAL_TABLET | Freq: Two times a day (BID) | ORAL | 0 refills | Status: AC

## 2023-08-24 NOTE — Discharge Instructions (Signed)
You were seen today for back pain.  Your history and physical exam is consistent with sciatica.  This is likely nerve impingement.  You may also have some muscle spasm.  Take medications as prescribed.  Do not drive while taking muscle relaxants.

## 2023-08-24 NOTE — ED Provider Notes (Signed)
[facility] AT [facility] Provider Note   CSN: [id number] Arrival date & time: 08/23/23  2238     History  Chief Complaint  Patient presents with   Back Pain    Albert [facility] is a 47 y.o. male.  HPI     This is a 47 year old male who presents with back pain.  Patient reports he has had progressive worsening lower back pain after lifting a heavy object approximately 24 hours ago.  He states the pain is mostly in the lower back and radiates into the right lower extremity.  Denies any bowel or bladder difficulty.  Denies any weakness.  Pain worsened throughout the day.  He has not taken anything for his pain.  Home Medications Prior to Admission medications   Medication Sig Start Date End Date Taking? Authorizing Provider  cyclobenzaprine (FLEXERIL) 10 MG tablet Take 1 tablet (10 mg total) by mouth 2 (two) times daily as needed for muscle spasms. 08/24/23  Yes Mayvis Agudelo, Mayer Masker, MD  naproxen (NAPROSYN) 500 MG tablet Take 1 tablet (500 mg total) by mouth 2 (two) times daily. 08/24/23  Yes Anetra Czerwinski, Mayer Masker, MD  acetaminophen (TYLENOL) 500 MG tablet Take 500 mg by mouth every 6 (six) hours as needed for moderate pain.    [provider]  diazepam (VALIUM) 5 MG tablet Take 1 tablet (5 mg total) by mouth every 8 (eight) hours as needed for muscle spasms. Patient not taking: Reported on 01/13/2018 09/18/13   Ward, Layla Maw, DO  methylPREDNISolone (MEDROL DOSEPAK) 4 MG tablet Take as directed Patient not taking: Reported on 01/13/2018 09/18/13   Ward, Layla Maw, DO  naproxen (NAPROSYN) 500 MG tablet Take 1 tablet (500 mg total) by mouth 2 (two) times daily. 01/13/18   Antony Madura, PA-C  oxyCODONE-acetaminophen (PERCOCET/ROXICET) 5-325 MG per tablet Take 1 tablet by mouth every 4 (four) hours as needed. Patient not taking: Reported on 01/13/2018 09/18/13   Ward, Layla Maw, DO      Allergies    Patient has no known allergies.    Review of Systems    Review of Systems  Constitutional:  Negative for fever.  Musculoskeletal:  Positive for back pain.  Neurological:  Negative for weakness.  All other systems reviewed and are negative.   Physical Exam Updated Vital Signs BP 119/79 (BP Location: Right Arm)   Pulse 88   Temp 97.8 F (36.6 C)   Resp 18   Ht 1.803 m (5\' 11" )   Wt 79.4 kg   SpO2 98%   BMI 24.41 kg/m  Physical Exam Vitals and nursing note reviewed.  Constitutional:      Appearance: He is well-developed. He is not ill-appearing.  HENT:     Head: Normocephalic and atraumatic.  Eyes:     Pupils: Pupils are equal, round, and reactive to light.  Cardiovascular:     Rate and Rhythm: Normal rate and regular rhythm.  Pulmonary:     Effort: Pulmonary effort is normal. No respiratory distress.  Abdominal:     Palpations: Abdomen is soft.  Musculoskeletal:     Cervical back: Neck supple.     Comments: Tenderness palpation lower lumbar spine and right paraspinous muscle region, spasm noted, positive right straight leg raise  Lymphadenopathy:     Cervical: No cervical adenopathy.  Skin:    General: Skin is warm and dry.  Neurological:     Mental Status: He is alert and oriented to person, place, and time.  Comments: 5 out of 5 strength bilateral lower extremities with plantar and dorsi flexion, hip flexion, knee extension  Psychiatric:        Mood and Affect: Mood normal.     ED Results / Procedures / Treatments   Labs (all labs ordered are listed, but only abnormal results are displayed) Labs Reviewed - No data to display  EKG None  Radiology No results found.  Procedures Procedures    Medications Ordered in ED Medications  ketorolac (TORADOL) 30 MG/ML injection 30 mg (30 mg Intramuscular Given 08/24/23 0009)  cyclobenzaprine (FLEXERIL) tablet 5 mg (5 mg Oral Given 08/24/23 0008)    ED Course/ Medical Decision Making/ A&P                                 Medical Decision  Making Risk Prescription drug management.   This patient presents to the ED for concern of back pain, this involves an extensive number of treatment options, and is a complaint that carries with it a high risk of complications and morbidity.  I considered the following differential and admission for this acute, potentially life threatening condition.  The differential diagnosis includes muscle spasm, musculoskeletal strain, sciatica, less likely cord impingement or epidural abscess  MDM:    This is a 47 year old male who presents with back pain.  Reports pain with heavy lifting.  He is nontoxic and vital signs are reassuring.  Description of pain is most consistent with sciatica.  He also has a positive straight leg raise.  No signs or symptoms of cauda equina.  No red flags.  Did not have any significant traumatic mechanism that would suggest fracture.  Patient was given Toradol and Flexeril.  Ultimately had improvement of his symptoms.  We discussed supportive measures and follow-up with orthopedics if not improving in 2 weeks.  Patient stated understanding.  (Labs, imaging, consults)  Labs: I Ordered, and personally interpreted labs.  The pertinent results include: None  Imaging Studies ordered: I ordered imaging studies including none I independently visualized and interpreted imaging. I agree with the radiologist interpretation  Additional history obtained from chart review.  External records from outside source obtained and reviewed including prior evaluations  Cardiac Monitoring: The patient was not maintained on a cardiac monitor.  If on the cardiac monitor, I personally viewed and interpreted the cardiac monitored which showed an underlying rhythm of: N/A  Reevaluation: After the interventions noted above, I reevaluated the patient and found that they have :improved  Social Determinants of Health:  lives independently  Disposition: Discharge  Co morbidities that complicate  the patient evaluation  Past Medical History:  Diagnosis Date   Chest pain    in setting of SVT, NL Cath 10/04/2011   SVT (supraventricular tachycardia) (HCC)    10/04/2011 - broke w vagal maneuvers   Tobacco abuse    20 pack yr history - currently smoking 1ppd   Wolf-Parkinson-White syndrome    Diagnosed 10/04/2011; NL LV fxn by echo 10/04/2011     Medicines Meds ordered this encounter  Medications   ketorolac (TORADOL) 30 MG/ML injection 30 mg   cyclobenzaprine (FLEXERIL) tablet 5 mg   cyclobenzaprine (FLEXERIL) 10 MG tablet    Sig: Take 1 tablet (10 mg total) by mouth 2 (two) times daily as needed for muscle spasms.    Dispense:  20 tablet    Refill:  0   naproxen (NAPROSYN) 500 MG tablet  Sig: Take 1 tablet (500 mg total) by mouth 2 (two) times daily.    Dispense:  30 tablet    Refill:  0    I have reviewed the patients home medicines and have made adjustments as needed  Problem List / ED Course: Problem List Items Addressed This Visit   None Visit Diagnoses     Sciatica of right side    -  Primary   Relevant Medications   cyclobenzaprine (FLEXERIL) tablet 5 mg (Completed)   cyclobenzaprine (FLEXERIL) 10 MG tablet                   Final Clinical Impression(s) / ED Diagnoses Final diagnoses:  Sciatica of right side    Rx / DC Orders ED Discharge Orders          Ordered    cyclobenzaprine (FLEXERIL) 10 MG tablet  2 times daily PRN        08/24/23 0121    naproxen (NAPROSYN) 500 MG tablet  2 times daily        08/24/23 0121              Shon Baton, MD 08/24/23 [phone]
